# Patient Record
Sex: Male | Born: 1975
Health system: Southern US, Community
[De-identification: ages and names within clinical notes are randomized; demographics above are authoritative.]

## PROBLEM LIST (undated history)

## (undated) DIAGNOSIS — M549 Dorsalgia, unspecified: Secondary | ICD-10-CM

## (undated) DIAGNOSIS — T7840XA Allergy, unspecified, initial encounter: Secondary | ICD-10-CM

## (undated) DIAGNOSIS — F419 Anxiety disorder, unspecified: Secondary | ICD-10-CM

## (undated) DIAGNOSIS — B019 Varicella without complication: Secondary | ICD-10-CM

## (undated) DIAGNOSIS — G8929 Other chronic pain: Secondary | ICD-10-CM

## (undated) HISTORY — PX: BUNIONECTOMY: SHX129

## (undated) HISTORY — DX: Allergy, unspecified, initial encounter: T78.40XA

## (undated) HISTORY — DX: Varicella without complication: B01.9

## (undated) HISTORY — DX: Other chronic pain: G89.29

## (undated) HISTORY — DX: Anxiety disorder, unspecified: F41.9

## (undated) HISTORY — PX: WISDOM TOOTH EXTRACTION: SHX21

## (undated) HISTORY — DX: Dorsalgia, unspecified: M54.9

---

## 2013-01-25 ENCOUNTER — Ambulatory Visit (INDEPENDENT_AMBULATORY_CARE_PROVIDER_SITE_OTHER): Payer: BC Managed Care – PPO | Admitting: Physician Assistant

## 2013-01-25 ENCOUNTER — Encounter: Payer: Self-pay | Admitting: Physician Assistant

## 2013-01-25 VITALS — BP 110/70 | HR 50 | Temp 97.7°F | Resp 16 | Ht 69.0 in | Wt 170.0 lb

## 2013-01-25 DIAGNOSIS — Z136 Encounter for screening for cardiovascular disorders: Secondary | ICD-10-CM

## 2013-01-25 DIAGNOSIS — Z0000 Encounter for general adult medical examination without abnormal findings: Secondary | ICD-10-CM | POA: Insufficient documentation

## 2013-01-25 LAB — TSH: TSH: 3.958 u[IU]/mL (ref 0.350–4.500)

## 2013-01-25 LAB — CBC WITH DIFFERENTIAL/PLATELET
Basophils Absolute: 0 10*3/uL (ref 0.0–0.1)
Basophils Relative: 0 % (ref 0–1)
Eosinophils Absolute: 0.3 10*3/uL (ref 0.0–0.7)
HCT: 41 % (ref 39.0–52.0)
Hemoglobin: 13.8 g/dL (ref 13.0–17.0)
Lymphs Abs: 2 10*3/uL (ref 0.7–4.0)
MCH: 28.9 pg (ref 26.0–34.0)
MCHC: 33.7 g/dL (ref 30.0–36.0)
MCV: 85.8 fL (ref 78.0–100.0)
Monocytes Absolute: 0.4 10*3/uL (ref 0.1–1.0)
Monocytes Relative: 8 % (ref 3–12)
Neutrophils Relative %: 47 % (ref 43–77)
RDW: 12.3 % (ref 11.5–15.5)

## 2013-01-25 LAB — LIPID PANEL
LDL Cholesterol: 86 mg/dL (ref 0–99)
Total CHOL/HDL Ratio: 3.2 Ratio

## 2013-01-25 LAB — BASIC METABOLIC PANEL
BUN: 16 mg/dL (ref 6–23)
CO2: 29 mEq/L (ref 19–32)
Calcium: 9.9 mg/dL (ref 8.4–10.5)
Glucose, Bld: 81 mg/dL (ref 70–99)

## 2013-01-25 NOTE — Patient Instructions (Signed)
Please obtain labs.  I will call you with your results.  If everything looks good, I will gladly sign your booklet to continue boxing.  It was a pleasure taking care of you today.

## 2013-01-25 NOTE — Assessment & Plan Note (Addendum)
Will obtain fasting labs.  Health Maintenance UTD.  EKG shows sinus bradycardia at rate of 38.  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

## 2013-01-25 NOTE — Progress Notes (Signed)
Patient ID: Patrick Ellis, male   DOB: 09/10/1975, 37 y.o.   MRN: [id number]  Patient presents to clinic today to establish care and for sports physical.  Acute Concerns: Patient expresses no acute concerns at today's vist  Chronic Issues: No significant PMH  Health Maintenance: Dental -- up-to-date Vision -- overdue. Immunizations -- Flu shot UTD.  Tb skin test up-to-date.    Past Medical History  Diagnosis Date  . Chicken pox     No current outpatient prescriptions on file prior to visit.   No current facility-administered medications on file prior to visit.    No Known Allergies  Family History  Problem Relation Age of Onset  . HIV Mother 74    Deceased  . Healthy Father   . Diabetes Paternal Grandfather   . Diabetes Paternal Grandmother   . Diabetes type II Paternal Aunt   . Diabetes Paternal Uncle   . Stroke Maternal Aunt   . Healthy Son     x1  . Healthy Daughter     x2    History   Social History  . Marital Status: Married    Spouse Name: N/A    Number of Children: N/A  . Years of Education: N/A   Social History Main Topics  . Smoking status: Never Smoker   . Smokeless tobacco: Never Used  . Alcohol Use: No  . Drug Use: No  . Sexual Activity: Yes    Birth Control/ Protection: None   Other Topics Concern  . None   Social History Narrative  . None   Review of Systems  Constitutional: Negative for fever, chills, weight loss and malaise/fatigue.  HENT: Negative for ear discharge, ear pain, hearing loss and tinnitus.   Eyes: Negative for blurred vision, double vision, photophobia and pain.  Respiratory: Negative for cough, shortness of breath and wheezing.   Cardiovascular: Negative for chest pain and palpitations.  Gastrointestinal: Negative for heartburn, nausea, vomiting, abdominal pain, diarrhea, constipation, blood in stool and melena.  Genitourinary: Negative for dysuria, urgency, frequency, hematuria and flank pain.   Musculoskeletal: Negative for myalgias.  Neurological: Negative for dizziness, seizures, loss of consciousness and headaches.  Endo/Heme/Allergies: Negative for environmental allergies.  Psychiatric/Behavioral: Negative for depression, suicidal ideas and substance abuse. The patient is not nervous/anxious and does not have insomnia.    Filed Vitals:   01/25/13 0943  BP: 110/70  Pulse: 50  Temp: 97.7 F (36.5 C)  Resp: 16   Physical Exam  Vitals reviewed. Constitutional: He is oriented to person, place, and time and well-developed, well-nourished, and in no distress.  HENT:  Head: Normocephalic and atraumatic.  Right Ear: External ear normal.  Left Ear: External ear normal.  Nose: Nose normal.  Mouth/Throat: Oropharynx is clear and moist. No oropharyngeal exudate.  Tympanic membranes within normal limits bilaterally  Eyes: Conjunctivae and EOM are normal. Pupils are equal, round, and reactive to light.  Neck: Normal range of motion. Neck supple.  Cardiovascular: Normal rate, regular rhythm, normal heart sounds and intact distal pulses.   Pulmonary/Chest: Effort normal and breath sounds normal. No respiratory distress. He has no wheezes. He has no rales. He exhibits no tenderness.  Abdominal: Soft. Bowel sounds are normal. He exhibits no distension and no mass. There is no tenderness. There is no rebound and no guarding. Hernia confirmed negative in the umbilical area, confirmed negative in the ventral area, confirmed negative in the right inguinal area and confirmed negative in the left inguinal area.  Genitourinary:  Testes/scrotum normal and penis normal.  Musculoskeletal: Normal range of motion.  Lymphadenopathy:    He has no cervical adenopathy.  Neurological: He is alert and oriented to person, place, and time. No cranial nerve deficit.  Skin: Skin is warm and dry. No rash noted.  Psychiatric: Affect normal.   Assessment/Plan: Visit for preventive health examination Will  obtain fasting labs.  Health Maintenance UTD.  EKG shows sinus bradycardia at rate of 38.  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

## 2013-01-26 LAB — TESTOSTERONE: Testosterone: 460 ng/dL (ref 300–890)

## 2013-01-26 LAB — URINALYSIS, ROUTINE W REFLEX MICROSCOPIC
Glucose, UA: NEGATIVE mg/dL
Hgb urine dipstick: NEGATIVE
Leukocytes, UA: NEGATIVE
Nitrite: NEGATIVE
Protein, ur: NEGATIVE mg/dL
Urobilinogen, UA: 0.2 mg/dL (ref 0.0–1.0)
pH: 5.5 (ref 5.0–8.0)

## 2013-08-20 ENCOUNTER — Telehealth: Payer: Self-pay | Admitting: Physician Assistant

## 2013-08-20 NOTE — Telephone Encounter (Signed)
That’s fine with me

## 2013-08-20 NOTE — Telephone Encounter (Signed)
OK with me.

## 2013-08-20 NOTE — Telephone Encounter (Signed)
Patient would like to switch to [facility]. Is this ok? Thanks!

## 2013-08-21 NOTE — Telephone Encounter (Signed)
Informed patient of this and he will call back to schedule appointment °

## 2015-09-22 ENCOUNTER — Ambulatory Visit: Payer: Self-pay | Admitting: Family Medicine

## 2015-09-23 ENCOUNTER — Encounter: Payer: Self-pay | Admitting: Family Medicine

## 2015-09-23 ENCOUNTER — Ambulatory Visit (INDEPENDENT_AMBULATORY_CARE_PROVIDER_SITE_OTHER): Payer: BLUE CROSS/BLUE SHIELD | Admitting: Family Medicine

## 2015-09-23 VITALS — BP 136/88 | HR 60 | Ht 68.0 in | Wt 165.0 lb

## 2015-09-23 DIAGNOSIS — M25521 Pain in right elbow: Secondary | ICD-10-CM | POA: Diagnosis not present

## 2015-09-23 NOTE — Patient Instructions (Signed)
You have a cyst. This is confirmed to be benign (not dangerous). In many cases these go away on their own. Medicines are unlikely to be helpful at this point. If this becomes bothersome in any way we could inject it with a little bit of cortisone or drain and inject it (you are unlikely to need this). No restrictions on activities or your training. Follow up with me as needed but call me with any questions.

## 2015-09-24 DIAGNOSIS — M25529 Pain in unspecified elbow: Secondary | ICD-10-CM | POA: Insufficient documentation

## 2015-09-24 NOTE — Assessment & Plan Note (Signed)
2/2 cyst.  Does not appear to communicate with the elbow joint - could be traumatic cyst as well.  Reassured patient.  This is not symptomatic so will just monitor - may resolve on its own as well.  Consider aspiration/injection in future if necessary or this bothers him.  F/u prn.  Doubt ibuprofen, other nsaids will help.  Also discussed compression.

## 2015-09-24 NOTE — Progress Notes (Signed)
PCP: Piedad ClimesMartin, William Cody, PA-C  Subjective:   HPI: Patient is a 40 y.o. male here for right elbow swelling.  Patient reports he noticed a small swollen area right elbow since March 2017. 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 Recalls hitting a punching bag on 11/30/14 and had some elbow pain that bothered him for a while but no swelling. Now has no pain with this. Is right handed. No numbness, skin changes, other complaints.  Past Medical History  Diagnosis Date  . Chicken pox     No current outpatient prescriptions on file prior to visit.   No current facility-administered medications on file prior to visit.    Past Surgical History  Procedure Laterality Date  . Bunionectomy      x2 bilateral  . Wisdom tooth extraction      No Known Allergies  Social History   Social History  . Marital Status: Married    Spouse Name: N/A  . Number of Children: N/A  . Years of Education: N/A   Occupational History  . Not on file.   Social History Main Topics  . Smoking status: Never Smoker   . Smokeless tobacco: Never Used  . Alcohol Use: No  . Drug Use: No  . Sexual Activity: Yes    Birth Control/ Protection: None   Other Topics Concern  . Not on file   Social History Narrative    Family History  Problem Relation Age of Onset  . HIV Mother 3045    Deceased  . Healthy Father   . Diabetes Paternal Grandfather   . Diabetes Paternal Grandmother   . Diabetes type II Paternal Aunt   . Diabetes Paternal Uncle   . Stroke Maternal Aunt   . Healthy Son     x1  . Healthy Daughter     x2    BP 136/88 mmHg  Pulse 60  Ht 5\' 8"  (1.727 m)  Wt 165 lb (74.844 kg)  BMI 25.09 kg/m2  Review of Systems: See HPI above.    Objective:  Physical Exam:  Gen: NAD, comfortable in exam room  Right elbow: Small well circumscribed mass over lateral epicondyle - about pea sized.  No skin changes, redness, other swelling or deformity. No tenderness throughout elbow. FROM elbow  and wrist without pain. 5/5 strength all motions. NVI distally.  Left elbow: FROM without pain.  MSK u/s right elbow: ~1.5 x 0.5cm cyst by largest dimension noted in area of swelling.  No communication seen with elbow joint.  No neovascularity.    Assessment & Plan:  1. Right elbow swelling - 2/2 cyst.  Does not appear to communicate with the elbow joint - could be traumatic cyst as well.  Reassured patient.  This is not symptomatic so will just monitor - may resolve on its own as well.  Consider aspiration/injection in future if necessary or this bothers him.  F/u prn.  Doubt ibuprofen, other nsaids will help.  Also discussed compression.

## 2017-01-31 ENCOUNTER — Other Ambulatory Visit (HOSPITAL_BASED_OUTPATIENT_CLINIC_OR_DEPARTMENT_OTHER): Payer: Self-pay | Admitting: Physical Medicine and Rehabilitation

## 2017-01-31 DIAGNOSIS — M545 Low back pain, unspecified: Secondary | ICD-10-CM

## 2017-01-31 DIAGNOSIS — E2749 Other adrenocortical insufficiency: Secondary | ICD-10-CM | POA: Diagnosis not present

## 2017-01-31 DIAGNOSIS — E291 Testicular hypofunction: Secondary | ICD-10-CM | POA: Diagnosis not present

## 2017-01-31 DIAGNOSIS — G894 Chronic pain syndrome: Secondary | ICD-10-CM

## 2017-01-31 DIAGNOSIS — M5127 Other intervertebral disc displacement, lumbosacral region: Secondary | ICD-10-CM

## 2017-01-31 DIAGNOSIS — M5417 Radiculopathy, lumbosacral region: Secondary | ICD-10-CM

## 2017-01-31 DIAGNOSIS — E038 Other specified hypothyroidism: Secondary | ICD-10-CM | POA: Diagnosis not present

## 2017-02-04 ENCOUNTER — Ambulatory Visit (HOSPITAL_BASED_OUTPATIENT_CLINIC_OR_DEPARTMENT_OTHER)
Admission: RE | Admit: 2017-02-04 | Discharge: 2017-02-04 | Disposition: A | Discharge status: Home / Self Care | Payer: BLUE CROSS/BLUE SHIELD | Source: Ambulatory Visit | Attending: Physical Medicine and Rehabilitation | Admitting: Physical Medicine and Rehabilitation

## 2017-02-04 DIAGNOSIS — M5136 Other intervertebral disc degeneration, lumbar region: Secondary | ICD-10-CM | POA: Insufficient documentation

## 2017-02-04 DIAGNOSIS — M5127 Other intervertebral disc displacement, lumbosacral region: Secondary | ICD-10-CM | POA: Diagnosis not present

## 2017-02-04 DIAGNOSIS — R9389 Abnormal findings on diagnostic imaging of other specified body structures: Secondary | ICD-10-CM | POA: Insufficient documentation

## 2017-02-04 DIAGNOSIS — G894 Chronic pain syndrome: Secondary | ICD-10-CM | POA: Diagnosis not present

## 2017-02-04 DIAGNOSIS — M545 Low back pain, unspecified: Secondary | ICD-10-CM

## 2017-02-04 DIAGNOSIS — M5417 Radiculopathy, lumbosacral region: Secondary | ICD-10-CM | POA: Insufficient documentation

## 2017-02-04 DIAGNOSIS — M8938 Hypertrophy of bone, other site: Secondary | ICD-10-CM | POA: Diagnosis not present

## 2017-02-07 DIAGNOSIS — E559 Vitamin D deficiency, unspecified: Secondary | ICD-10-CM | POA: Diagnosis not present

## 2017-02-07 DIAGNOSIS — E291 Testicular hypofunction: Secondary | ICD-10-CM | POA: Diagnosis not present

## 2017-02-07 DIAGNOSIS — E539 Vitamin B deficiency, unspecified: Secondary | ICD-10-CM | POA: Diagnosis not present

## 2017-06-13 DIAGNOSIS — E349 Endocrine disorder, unspecified: Secondary | ICD-10-CM | POA: Diagnosis not present

## 2017-06-13 DIAGNOSIS — E559 Vitamin D deficiency, unspecified: Secondary | ICD-10-CM | POA: Diagnosis not present

## 2017-06-13 DIAGNOSIS — E539 Vitamin B deficiency, unspecified: Secondary | ICD-10-CM | POA: Diagnosis not present

## 2017-06-13 DIAGNOSIS — E038 Other specified hypothyroidism: Secondary | ICD-10-CM | POA: Diagnosis not present

## 2017-06-19 DIAGNOSIS — E559 Vitamin D deficiency, unspecified: Secondary | ICD-10-CM | POA: Diagnosis not present

## 2017-06-19 DIAGNOSIS — E291 Testicular hypofunction: Secondary | ICD-10-CM | POA: Diagnosis not present

## 2017-06-19 DIAGNOSIS — E038 Other specified hypothyroidism: Secondary | ICD-10-CM | POA: Diagnosis not present

## 2017-06-19 DIAGNOSIS — E539 Vitamin B deficiency, unspecified: Secondary | ICD-10-CM | POA: Diagnosis not present

## 2017-08-11 ENCOUNTER — Encounter: Payer: Self-pay | Admitting: Neurology

## 2017-08-14 ENCOUNTER — Encounter: Payer: Self-pay | Admitting: Neurology

## 2017-08-14 ENCOUNTER — Ambulatory Visit: Payer: BLUE CROSS/BLUE SHIELD | Admitting: Neurology

## 2017-08-14 VITALS — BP 114/76 | HR 47 | Ht 70.0 in | Wt 171.0 lb

## 2017-08-14 DIAGNOSIS — M549 Dorsalgia, unspecified: Secondary | ICD-10-CM

## 2017-08-14 DIAGNOSIS — Z72820 Sleep deprivation: Secondary | ICD-10-CM | POA: Insufficient documentation

## 2017-08-14 DIAGNOSIS — G4726 Circadian rhythm sleep disorder, shift work type: Secondary | ICD-10-CM

## 2017-08-14 DIAGNOSIS — F5104 Psychophysiologic insomnia: Secondary | ICD-10-CM

## 2017-08-14 MED ORDER — MELATONIN 5 MG PO TABS: 5.0000 mg | ORAL_TABLET | Freq: Every day | ORAL | 0 refills | Status: DC

## 2017-08-14 NOTE — Progress Notes (Signed)
SLEEP MEDICINE CLINIC   Provider:  Melvyn Novas, M D  Primary Care Physician:    Referring Provider: Deberah Castle,*   Chief Complaint  Patient presents with  . New Patient (Initial Visit)    pt alone, rm 10. pt has been seeing a pain specialist for over a year, pt has hard time with going to sleep, and when he does he doesnt sleep long. feels as if his mind never shuts off. pt wife has told him that he snores intermittently. 3 -4 hours of uninterupted sleep. wakes up and hard to sleep. there are times where he doesnt sleep at all and he also states there are times he binge sleeps    HPI:  Patrick Ellis is a 42 y.o. male , seen here as in a referral   from Dr. Nickola Major for a sleep evaluation.    Chief complaint according to patient :  "trouble going to sleep and staying asleep, waking up feeling numb or just tossing all night" .  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  His last significant change in work hours was in early April about 6 to 7 weeks ago. His Sunday, Monday and Tuesday hours are from 7 PM to 7 AM. Only one job now.  Patrick Ellis who is meanwhile 43 years old has the following history is quoted from his referring physicians notes.  He has been experiencing mid and lower back pain for decades now, starting in 1995 while he was in the [employer]	[place].  He carried a 8 pound radio in addition to his MRI 16 and other things in his backpack, he estimates the weight was upwards of 35 pounds and he hiked 10-20 miles each month, at times he would carry a grenade launcher also not on the longest hikes.  He is right hip and bilateral knee pain as well as ankle pain the pain has slightly worsened over time but it is progressive.  [facility], x-rays were done but he did not get information until very recently a few weeks ago he experienced the most extreme of  pain.  He states his whole body felt like a toothache.   He characterizes pain between 5 out of 10 in intensity to 7 out of 10 in intensity at baseline.  Pain is worse with resuming movement after a long time sitting lying or standing stiffness in the mornings to get out of bed, sitting prolonged by driving, and he has felt that massage and chiropractic treatments in the time have helped he also is using Aspercreme.  Exercise helps the pain while it lasts, sometimes he feels however that he pays a price at night after exercise and sleeps even less.  The difficulties to initiate sleep and staying asleep developed during his [employer] service as well.     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 30,  sometimes he can go home within 15 minutes, and in bed by 9-10 AM.  He doesn't sleep deep but he sleeps until 12 or 1 PM. He cannot go back to sleep- get's and eats. Children come back by 3.30 PM.  Bedroom- cool,quiet, but not dark. Daylight penetrates the room. Mattress is fine , he reports, he struggles to  find a comfortable spot. Toes get numb, fingers and hands get numb. He needs to stretch. Sleeps  on 1 pillow.  Feels not rested.     Sleep medical history and family sleep history: Biological father raised him , biological mother died in Jul 27, 2000, drug user , when he was 25 of AIDS.  No sibling contact  - half sister.  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 He drinks herbal tonics- caffeine through green tea,  not coffee, not sodas, but iced tea.  married with children. Non tobacco user, never has, no ETOH. He used to box- gave boxing up in 07/28/2014. Never has LOC while boxing.  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    .  Review of Systems: Out of a complete 14 system review, the patient complains of only the following symptoms, and all other reviewed systems are negative.  wife reports he snores, purring.  Twitching, numbness, stiffness.    Epworth score 8/ 24  , Fatigue severity score 41   , depression score n/a    Social History   Socioeconomic History  . Marital status: Married    Spouse name: Not on file  . Number of children: Not on file  . Years of education: Not on file  . Highest education level: Not on file  Occupational History  . Not on file  Social Needs  . Financial resource strain: Not on file  . Food insecurity:    Worry: Not on file    Inability: Not on file  . Transportation needs:    Medical: Not on file    Non-medical: Not on file  Tobacco Use  . Smoking status: Never Smoker  . Smokeless tobacco: Never Used  Substance and Sexual Activity  . Alcohol use: No    Alcohol/week: 0.0 oz  . Drug use: No  . Sexual activity: Yes    Birth control/protection: None  Lifestyle  . Physical activity:    Days per week: Not on file    Minutes per session: Not on file  . Stress: Not on file  Relationships  . Social connections:    Talks on phone: Not on file    Gets together: Not on file    Attends religious service: Not on file    Active member of club or organization: Not on file    Attends meetings of clubs or organizations: Not on file    Relationship status: Not on file  . Intimate partner violence:    Fear of current or ex partner: Not on file    Emotionally abused: Not on file    Physically abused: Not on file    Forced sexual activity: Not on file  Other Topics Concern  . Not on file  Social History Narrative  . Not on file    Family History  Problem Relation Age of Onset  . HIV Mother 82       Deceased  . Diabetes Father   . Diabetes Paternal Grandfather   . Diabetes Paternal Grandmother   . Diabetes type II Paternal Aunt   . Diabetes Paternal Uncle   . Stroke Maternal Aunt   . Healthy Son        x1  . Healthy Daughter        x2    Past Medical History:  Diagnosis Date  . Chicken pox     Past Surgical History:  Procedure Laterality Date  . BUNIONECTOMY     x2  bilateral  . WISDOM TOOTH EXTRACTION      Current Outpatient Medications  Medication  Sig Dispense Refill  . DHEA 50 MG CAPS Take 1 capsule by mouth daily.    Marland Kitchen liothyronine (CYTOMEL) 5 MCG tablet Take 1 tablet by mouth daily.     No current facility-administered medications for this visit.     Allergies as of 08/14/2017  . (No Known Allergies)    Vitals: BP 114/76   Pulse (!) 47   Ht  (1.778 m)   Wt 171 lb (77.6 kg)   BMI 24.54 kg/m  Last Weight:  Wt Readings from Last 1 Encounters:  08/14/17 171 lb (77.6 kg)   NWG:NFAO mass index is 24.54 kg/m.     Last Height:   Ht Readings from Last 1 Encounters:  08/14/17  (1.778 m)    Physical exam:  General: The patient is awake, alert and appears not in acute distress. The patient is well groomed. Head: Normocephalic, atraumatic. Neck is supple. Mallampati 2-3,  neck circumference: 15. 25 . Nasal airflow patent ,  TMJ is not evident . Retrognathia is not seen.  Cardiovascular:  Regular rate and rhythm , without  murmurs or carotid bruit, and without distended neck veins. Respiratory: Lungs are clear to auscultation. Skin:  Without evidence of edema, or rash Trunk: BMI is normal . The patient's posture is erect   Neurologic exam : The patient is awake and alert, oriented to place and time.    Attention span & concentration ability appears normal.  Speech is fluent,  without dysarthria, dysphonia or aphasia.  Mood and affect are appropriate.  Cranial nerves: Pupils are equal and briskly reactive to light. Funduscopic exam without evidence of pallor or edema. Extraocular movements  in vertical and horizontal planes intact and without nystagmus. Visual fields by finger perimetry are intact. Hearing to finger rub intact. Facial sensation intact to fine touch. Facial motor strength is symmetric , the  tongue and uvula move midline. Shoulder shrug was symmetrical.  Motor exam:  Normal tone, muscle bulk and symmetric  strength in all extremities. Sensory:  Fine touch, pinprick and vibration were tested in all extremities. Proprioception tested in the upper extremities was normal. Coordination: Rapid alternating movements in the fingers/hands was normal. Finger-to-nose maneuver  normal without evidence of ataxia, dysmetria or tremor. Gait and station: Patient walks without assistive device  Stance is stable and normal.   Turns with 3 Steps. Romberg testing is  negative. Deep tendon reflexes: in the  upper and lower extremities are symmetric and intact. Babinski maneuver response is  downgoing.   I would like to thank Dr. Ginger Organ for referring this pleasant patient to me today, he reports a slow progressive decline in sleep quality certainly correlating with his reported pain increasing over the same time.  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  His daytime sleep is not as refreshing or as consolidated as it could be.  We discussed today that he may improve his sleep efficiency by using a light blocking sleep mask or by using light blocking techniques on the window in the bedroom itself.  I also would like him to consider using melatonin 5 mg or less once he comes home after work.  This may ease him into sleep and allow him to sleep a little longer.  His wife has noticed audible breathing and possibly snoring.  For this reason a sleep test is indicated.  There has not been a witnessed event  of breath-holding, air hunger weakness, the feeling of choking in his sleep.  [facility] were not [facility] to me- he had another exam last Saturday. No report of PTSD, sleep walking, sleep enactment.   Assessment:  After physical and neurologic examination, review of laboratory studies,  Personal review of imaging studies, reports of other /same  Imaging studies, results of polysomnography and / or neurophysiology testing and pre-existing  records as far as provided in visit., my assessment is   1)  PSG with daytime set up for a shift worker. We may find evidence of PLMs, hypoxia. There can be many spontaneous events - I would like a daytime attended sleep study.Marland Kitchen   2) Pain may affect sleep.   3) insomnia in daytime , restricted sleep time,sleep deprivation.    The patient was advised of the nature of the diagnosed disorder , the treatment options and the  risks for general health and wellness arising from not treating the condition.   I spent more than 45 minutes of face to face time with the patient.  Greater than 50% of time was spent in counseling and coordination of care. We have discussed the diagnosis and differential and I answered the patient's questions.    Plan:  Treatment plan and additional workup :  Attended PSG with expanded EEG montage , daytime for shift worker.     Melvyn Novas, MD 08/14/2017, 1:16 PM  Certified in Neurology by ABPN Certified in Sleep Medicine by [facility] [address] [place], [place] [address]

## 2017-08-15 ENCOUNTER — Other Ambulatory Visit: Payer: Self-pay | Admitting: Neurology

## 2017-08-15 ENCOUNTER — Telehealth: Payer: Self-pay

## 2017-08-15 DIAGNOSIS — Z72820 Sleep deprivation: Secondary | ICD-10-CM

## 2017-08-15 DIAGNOSIS — F5104 Psychophysiologic insomnia: Secondary | ICD-10-CM

## 2017-08-15 DIAGNOSIS — G4726 Circadian rhythm sleep disorder, shift work type: Secondary | ICD-10-CM

## 2017-08-15 NOTE — Telephone Encounter (Signed)
Order placed

## 2017-08-15 NOTE — Telephone Encounter (Signed)
[employer] denied in lab sleep study, need HST order 

## 2017-08-29 ENCOUNTER — Encounter: Payer: Self-pay | Admitting: Neurology

## 2017-08-29 ENCOUNTER — Ambulatory Visit: Payer: BLUE CROSS/BLUE SHIELD | Admitting: Neurology

## 2017-08-29 ENCOUNTER — Ambulatory Visit (INDEPENDENT_AMBULATORY_CARE_PROVIDER_SITE_OTHER): Payer: BLUE CROSS/BLUE SHIELD | Admitting: Neurology

## 2017-08-29 DIAGNOSIS — R202 Paresthesia of skin: Secondary | ICD-10-CM

## 2017-08-29 DIAGNOSIS — F5104 Psychophysiologic insomnia: Secondary | ICD-10-CM

## 2017-08-29 NOTE — Procedures (Signed)
     HISTORY:  Heloise BeechamLatareus Knobloch is a 42 year old gentleman with a 3-year history of episodes of numbness and sensory alteration beginning in the right foot with a gradual spread over 3 to 4 minutes to include the right arm and back of the head, the patient will have some twitching of the right lower face with these events, with resolution of the sensory episodes in 15 or 20 minutes.  The patient is averaging 1 event a week.  He is being evaluated for this.  NERVE CONDUCTION STUDIES:  Nerve conduction studies were performed on the right upper extremity. The distal motor latencies and motor amplitudes for the median and ulnar nerves were within normal limits. The nerve conduction velocities for these nerves were also normal. The sensory latencies for the median, radial, and ulnar nerves were normal.  The F-wave latency for the right ulnar nerve was within normal limits.  Nerve conduction studies were performed on the right lower extremity. The distal motor latencies and motor amplitudes for the peroneal and posterior tibial nerves were within normal limits. The nerve conduction velocities for these nerves were also normal. The sensory latencies for the peroneal and sural nerves were within normal limits.  The F-wave latency for the right posterior tibial nerve was normal.   EMG STUDIES:  EMG study was performed on the right upper extremity:  The first dorsal interosseous muscle reveals 2 to 4 K units with full recruitment. No fibrillations or positive waves were noted. The abductor pollicis brevis muscle reveals 2 to 4 K units with full recruitment. No fibrillations or positive waves were noted. The extensor indicis proprius muscle reveals 1 to 3 K units with full recruitment. No fibrillations or positive waves were noted. The biceps muscle reveals 1 to 2 K units with full recruitment. No fibrillations or positive waves were noted. The triceps muscle reveals 2 to 4 K units with full recruitment.  No fibrillations or positive waves were noted. The anterior deltoid muscle reveals 2 to 3 K units with full recruitment. No fibrillations or positive waves were noted. The cervical paraspinal muscles were tested at 2 levels. No abnormalities of insertional activity were seen at either level tested. There was good relaxation.  EMG study was performed on the right lower extremity:  The tibialis anterior muscle reveals 2 to 4K motor units with full recruitment. No fibrillations or positive waves were seen. The peroneus tertius muscle reveals 2 to 4K motor units with full recruitment. No fibrillations or positive waves were seen. The medial gastrocnemius muscle reveals 1 to 3K motor units with full recruitment. No fibrillations or positive waves were seen. The vastus lateralis muscle reveals 2 to 4K motor units with full recruitment. No fibrillations or positive waves were seen. The iliopsoas muscle reveals 2 to 4K motor units with full recruitment. No fibrillations or positive waves were seen. The lumbosacral paraspinal muscles were tested at 3 levels, and revealed no abnormalities of insertional activity at all 3 levels tested. There was good relaxation.   IMPRESSION:  Nerve conduction studies done on the right upper and lower extremities were unremarkable, no evidence of a neuropathy is seen.  EMG of the right upper and right lower extremities were unremarkable without evidence of a cervical or lumbar radiculopathy noted.  Marlan Palau. Keith Melynda Krzywicki MD 08/29/2017 10:18 AM  [facility] [address] [place], [place] [id number]  Phone [phone] Fax [phone]

## 2017-08-29 NOTE — Progress Notes (Signed)
Please refer to EMG and nerve conduction study procedure note. 

## 2017-08-30 NOTE — Progress Notes (Signed)
[facility]    Nerve / Sites Muscle Latency Ref. Amplitude Ref. Rel Amp Segments Distance Velocity Ref. Area    ms ms mV mV %  cm m/s m/s mVms  R Median - APB     Wrist APB 3.4 ?4.4 6.3 ?4.0 100 Wrist - APB 7   19.0     Upper arm APB 7.9  5.9  94.2 Upper arm - Wrist 25 56 ?49 17.7  R Ulnar - ADM     Wrist ADM 2.5 ?3.3 10.5 ?6.0 100 Wrist - ADM 7   35.5     B.Elbow ADM 6.6  8.7  83.2 B.Elbow - Wrist 22 54 ?49 30.6     A.Elbow ADM 8.8  8.9  102 A.Elbow - B.Elbow 12 55 ?49 33.2         A.Elbow - Wrist      R Peroneal - EDB     Ankle EDB 5.1 ?6.5 7.8 ?2.0 100 Ankle - EDB 9   22.8     Fib head EDB 11.9  7.6  98.3 Fib head - Ankle 33 48 ?44 23.2     Pop fossa EDB 14.1  6.2  81 Pop fossa - Fib head 10 47 ?44 19.1         Pop fossa - Ankle      R Tibial - AH     Ankle AH 5.3 ?5.8 11.7 ?4.0 100 Ankle - AH 9   25.7     Pop fossa AH 14.2  4.1  34.6 Pop fossa - Ankle 37 42 ?41 12.9             [facility]    Nerve / Sites Rec. Site Peak Lat Ref.  Amp Ref. Segments Distance Peak Diff Ref.    ms ms V V  cm ms ms  R Radial - Anatomical snuff box (Forearm)     Forearm Wrist 2.7 ?2.9 12 ?15 Forearm - Wrist 10    R Sural - Ankle (Calf)     Calf Ankle 3.6 ?4.4 9 ?6 Calf - Ankle 14    R Superficial peroneal - Ankle     Lat leg Ankle 3.9 ?4.4 8 ?6 Lat leg - Ankle 14    R Median, Ulnar - Transcarpal comparison     Median Palm Wrist 2.2 ?2.2 42 ?35 Median Palm - Wrist 8       Ulnar Palm Wrist 2.1 ?2.2 12 ?12 Ulnar Palm - Wrist 8          Median Palm - Ulnar Palm  0.2 ?0.4  R Median - Orthodromic (Dig II, Mid palm)     Dig II Wrist 3.3 ?3.4 14 ?10 Dig II - Wrist 13    R Ulnar - Orthodromic, (Dig V, Mid palm)     Dig V Wrist 2.8 ?3.1 10 ?5 Dig V - Wrist 2811                   F  Wave    Nerve F Lat Ref.   ms ms  R Tibial - AH 30.9 ?56.0  R Ulnar - ADM 29.7 ?32.0         EMG full

## 2017-09-06 ENCOUNTER — Ambulatory Visit (INDEPENDENT_AMBULATORY_CARE_PROVIDER_SITE_OTHER): Payer: BLUE CROSS/BLUE SHIELD | Admitting: Neurology

## 2017-09-06 DIAGNOSIS — Z72820 Sleep deprivation: Secondary | ICD-10-CM

## 2017-09-06 DIAGNOSIS — G4710 Hypersomnia, unspecified: Secondary | ICD-10-CM

## 2017-09-06 DIAGNOSIS — G4726 Circadian rhythm sleep disorder, shift work type: Secondary | ICD-10-CM

## 2017-09-06 DIAGNOSIS — F5104 Psychophysiologic insomnia: Secondary | ICD-10-CM

## 2017-09-14 ENCOUNTER — Other Ambulatory Visit: Payer: Self-pay | Admitting: Neurology

## 2017-09-14 ENCOUNTER — Telehealth: Payer: Self-pay | Admitting: Neurology

## 2017-09-14 DIAGNOSIS — G4761 Periodic limb movement disorder: Secondary | ICD-10-CM

## 2017-09-14 DIAGNOSIS — R202 Paresthesia of skin: Secondary | ICD-10-CM

## 2017-09-14 DIAGNOSIS — F5104 Psychophysiologic insomnia: Secondary | ICD-10-CM

## 2017-09-14 DIAGNOSIS — Z72820 Sleep deprivation: Secondary | ICD-10-CM

## 2017-09-14 NOTE — Telephone Encounter (Signed)
Called the patient. His wife answered. I offered to review the sleep study results with her. She request to take a message and have the pt contact us back. Gave her the number to the office for him to return our call.

## 2017-09-14 NOTE — Telephone Encounter (Signed)
Pt returned call and I was able to review the home sleep test results with him. I informed him that the apnea was not found on the HST and his oxygen and heart rate remained stable. Dr Dohmeier states that since his main concern was more concern of movements and also looking at insomnia our hope is that insurance will allow us to bring him in for a in lab study to evaluate the extremities and insomnia concern. Pt verbalized understanding and was appreciative.

## 2017-09-15 NOTE — Procedures (Signed)
[facility] [address] [place], [place] [address] NAME:   Heloise BeechamLatareus Ellis                                                               DOB: 09/17/1975 MEDICAL RECORD NUMBER [id number]                                                      DOS:  09/06/17 REFERRING PHYSICIAN: Herminio HeadsShawn Dalton- Bethea, MD STUDY PERFORMED: Home Sleep Study on Apnea link   HISTORY: Marita KansasLatareus T. Calzada is a 42 y.o. male patient, seen here for "trouble going to sleep and staying asleep, waking up feeling numb or just tossing all night".   [name] works night shifts for many years and has often worked 2 jobs.  His last significant change in work hours was in early April about 6 to 7 weeks ago. His Sunday, Monday and Tuesday hours are from 7 PM to 7 AM, working one job now.  He has back pain, has right hip and bilateral knee pain as well as ankle pain- the pain has slightly worsened over time but it is progressive and interferes with his rest. [facility], x-rays were done after he experienced the most extreme of pain. He states his whole body felt like a toothache.   Epworth Sleepiness score: 8/ 24, Fatigue severity score: 41 points, BMI: 24.5  STUDY RESULTS:  Total Recording Time: 8 hours, 45 minutes- valid test time 8 h 5 minutes Total Apnea/Hypopnea Index (AHI):  0.1/h, RDI:  5.2 /h Average Oxygen Saturation:   96 %, Lowest Oxygen Saturation: 90 % =Total Time in Oxygen Saturation below 89 %: 0.0 minutes  Average Heart Rate:   49 bpm in NSR (between 40 and 91 bpm) IMPRESSION: This HST does not indicate that apnea is present, but snoring is moderate. Given the patients symptoms of pain in limbs and back, a HST is insufficient to establish PLM presence or even confirm insomnia. An attended sleep study is needed to confirm if insomnia is present or the complaint based on a different perception of sleep.   RECOMMENDATION: attended PSG with parasomnia EEG / limb movement attention. I certify that I have reviewed the raw data recording prior to the issuance of this report in accordance with the standards of the American Academy of Sleep Medicine (AASM). Melvyn Novasarmen Abigayl Hor, M.D.    09-14-2017     Medical Director of [facility], accredited by the AASM. Diplomat of the ABPN and ABSM.

## 2017-09-18 ENCOUNTER — Encounter: Payer: BLUE CROSS/BLUE SHIELD | Admitting: Neurology

## 2017-10-09 ENCOUNTER — Encounter

## 2017-10-09 ENCOUNTER — Institutional Professional Consult (permissible substitution): Payer: BLUE CROSS/BLUE SHIELD | Admitting: Neurology

## 2017-10-11 ENCOUNTER — Ambulatory Visit (INDEPENDENT_AMBULATORY_CARE_PROVIDER_SITE_OTHER): Payer: BLUE CROSS/BLUE SHIELD | Admitting: Neurology

## 2017-10-11 DIAGNOSIS — F5104 Psychophysiologic insomnia: Secondary | ICD-10-CM

## 2017-10-11 DIAGNOSIS — R202 Paresthesia of skin: Secondary | ICD-10-CM

## 2017-10-11 DIAGNOSIS — Z72820 Sleep deprivation: Secondary | ICD-10-CM

## 2017-10-11 DIAGNOSIS — G4761 Periodic limb movement disorder: Secondary | ICD-10-CM

## 2017-10-20 NOTE — Procedures (Signed)
PATIENT'S NAME:  Patrick Ellis, Patrick T. DOB:      Oct 15, 1975      MR#:    [id number]     DATE OF RECORDING: 10/11/2017 REFERRING M.D.:  Patrick HeadsShawn Dalton-Bethea, MD Study Performed:   Baseline Polysomnogram, following HST from 09-06-2017 without diagnosis . HISTORY:  Patrick Ellis is a 42 y.o. male patient, seen here as in a referral from Dr. Nickola Ellis for a sleep evaluation. Chief complaint according to patient:  "trouble going to sleep and staying asleep, waking up feeling numb or just tossing all night".  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 His last significant change in work hours was in early April 2019. His Sunday, Monday and Tuesday hours are now from 7 PM to 7 AM. Only one job now. 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 Work is over at 7. 30 AM, sometimes he can go home within 15 minutes, and is in bed by 9 AM. He doesn't sleep deep but he sleeps until 12 or 1 PM.    Patrick Ellis has been experiencing mid and lower back pain for decades now, starting in 1995 while he was in the [employer]. He carried his backpack, he estimates the weight was upwards of 35 pounds and he hiked 10-20 miles each month, at times he had to carry a Economistgrenade launcher.  His right hip and bilateral knee pain as well as ankle pain the pain has slightly worsened over time. He states his whole body felt like a toothache. Pain is worse with resuming movement after a long time sitting lying or standing- painful stiffness.  Exercise helps the pain while it lasts, sometimes he feels however that he pays a price at night after exercise and sleeps even less.  The difficulties to initiate sleep and staying asleep developed during his [employer] service as well.  The patient endorsed the Epworth Sleepiness Scale at 8/24 points, FSS 41 points.   The patient's weight 171 pounds with a height of 70 (inches), resulting in a BMI of 24.6 kg/m2. The patient's neck circumference measured  15.2 inches.  CURRENT MEDICATIONS: DHEA, Cytomel.   PROCEDURE:  This is a multichannel digital polysomnogram utilizing the Somnostar 11.2 system.  Electrodes and sensors were applied and monitored per AASM Specifications.   EEG, EOG, Chin and Limb EMG, were sampled at 200 Hz.  ECG, Snore and Nasal Pressure, Thermal Airflow, Respiratory Effort, CPAP Flow and Pressure, Oximetry was sampled at 50 Hz. Digital video and audio were recorded.      BASELINE STUDY: Lights Out was at 23:03 and Lights On at 05:00.  Total recording time (TRT) was 357.5 minutes, with a total sleep time (TST) of 281.5 minutes. The patient's sleep latency was 13 minutes.  REM latency was 81 minutes.  The sleep efficiency was 78.7 %.     SLEEP ARCHITECTURE: WASO (Wake after sleep onset) was 34 minutes.  There were 19.5 minutes in Stage N1, 168 minutes Stage N2, 68.5 minutes Stage N3 and 25.5 minutes in Stage REM.  The percentage of Stage N1 was 6.9%, Stage N2 was 59.7%, Stage N3 was 24.3% and Stage R (REM sleep) was 9.1%.  RESPIRATORY ANALYSIS:  There were a total of 13 respiratory events:  0 obstructive apneas, 5 central apneas and 8 hypopneas. The patient also had 0 respiratory event related arousals (RERAs).      The total APNEA/HYPOPNEA INDEX (AHI) was 2.8 /hour and the total RESPIRATORY DISTURBANCE INDEX  was 2.8 /hour. 5 events occurred in REM sleep and 13 events in NREM. The REM AHI was 11.8 /hour, versus a non-REM AHI of 1.9. The patient spent 216 minutes of total sleep time in the supine position and 66 minutes in non-supine. The supine AHI was 3.6 versus a non-supine AHI of 0.0.  OXYGEN SATURATION & C02:  The Wake baseline 02 saturation was 93%, with the lowest being 91%. Time spent below 89% saturation equaled 0 minutes.   PERIODIC LIMB MOVEMENTS:  The patient had a total of 5 Periodic Limb Movements.  The Periodic Limb Movement (PLM) index was 1.1 and the PLM Arousal index was 0.4/hour. There were many non - periodic  movements. The arousals were noted as: 71 were spontaneous, 2 were associated with PLMs, and 2 were associated with respiratory events.     Audio and video analysis did not show any abnormal or unusual movements, but yelled out during REM sleep. The patient took bathroom break. Snoring was noted during supine sleep. EKG was in keeping with normal sinus rhythm (NSR). Post-study, the patient indicated that sleep was the same as usual.   IMPRESSION: Extremely fragmented sleep, due to spontaneous arousals.   1. No evidence of sleep apnea, only snoring.  2. Very frequent Limb Movements, some periodic. PLM Disorder (PLMD) 3. Behavior noted, yelling in REM. REM BD is a possibility- but limb movements were not present during REM sleep.    RECOMMENDATIONS:  1) Spontaneous arousals can be pain related.  2)   I certify that I have reviewed the entire raw data recording prior to the issuance of this report in accordance with the Standards of Accreditation of the American Academy of Sleep Medicine (AASM)   Melvyn Novas, MD    10-20-2017  Diplomat, American Board of Psychiatry and Neurology  Diplomat, American Board of Sleep Medicine Medical Director, [facility] [employer]

## 2017-10-25 ENCOUNTER — Telehealth: Payer: Self-pay | Admitting: Neurology

## 2017-10-25 NOTE — Telephone Encounter (Signed)
-----   Message from Carmen Dohmeier, MD sent at 10/20/2017  2:38 PM EDT ----- Cc Dr Dalton- Bethea  Should read no PLM disorder!   This attended study confirmed LMs, non -periodic limb movements ( these are seen with pain) and do not correlate to RLS, REM BD- PLMs Mainly many spontaneous arousals.  Since there were no other physiological changes associated with spontaneous arousals I suspect that these are pain/ sensation related.     

## 2017-10-25 NOTE — Telephone Encounter (Signed)
Called patient to discuss sleep study results. No answer at this time. LVM for the patient to call back.   

## 2017-10-26 ENCOUNTER — Telehealth: Payer: Self-pay | Admitting: Neurology

## 2017-10-26 NOTE — Telephone Encounter (Signed)
Pt returned call and I was able to review with the patient the sleep study results. Informed the patient that there was nothing of any concern that could be treated for sleep.  Pt inquired about the NCV test in June and I reviewed those results with him also. Pt verbalized understanding. Pt had no questions at this time but was encouraged to call back if questions arise.

## 2017-10-26 NOTE — Telephone Encounter (Signed)
-----   Message from Melvyn Novasarmen Dohmeier, MD sent at 10/20/2017  2:38 PM EDT ----- Cc Dr Freida BusmanaltonEduard Clos- Bethea  Should read no PLM disorder!   This attended study confirmed LMs, non -periodic limb movements ( these are seen with pain) and do not correlate to RLS, REM BD- PLMs Mainly many spontaneous arousals.  Since there were no other physiological changes associated with spontaneous arousals I suspect that these are pain/ sensation related.

## 2017-12-28 DIAGNOSIS — M542 Cervicalgia: Secondary | ICD-10-CM | POA: Diagnosis not present

## 2017-12-28 DIAGNOSIS — M9902 Segmental and somatic dysfunction of thoracic region: Secondary | ICD-10-CM | POA: Diagnosis not present

## 2017-12-28 DIAGNOSIS — M9901 Segmental and somatic dysfunction of cervical region: Secondary | ICD-10-CM | POA: Diagnosis not present

## 2017-12-28 DIAGNOSIS — M47812 Spondylosis without myelopathy or radiculopathy, cervical region: Secondary | ICD-10-CM | POA: Diagnosis not present

## 2018-01-03 DIAGNOSIS — M47812 Spondylosis without myelopathy or radiculopathy, cervical region: Secondary | ICD-10-CM | POA: Diagnosis not present

## 2018-01-03 DIAGNOSIS — M9902 Segmental and somatic dysfunction of thoracic region: Secondary | ICD-10-CM | POA: Diagnosis not present

## 2018-01-03 DIAGNOSIS — M542 Cervicalgia: Secondary | ICD-10-CM | POA: Diagnosis not present

## 2018-01-03 DIAGNOSIS — M9901 Segmental and somatic dysfunction of cervical region: Secondary | ICD-10-CM | POA: Diagnosis not present

## 2018-01-04 DIAGNOSIS — M542 Cervicalgia: Secondary | ICD-10-CM | POA: Diagnosis not present

## 2018-01-04 DIAGNOSIS — M9901 Segmental and somatic dysfunction of cervical region: Secondary | ICD-10-CM | POA: Diagnosis not present

## 2018-01-04 DIAGNOSIS — M9902 Segmental and somatic dysfunction of thoracic region: Secondary | ICD-10-CM | POA: Diagnosis not present

## 2018-01-04 DIAGNOSIS — M47812 Spondylosis without myelopathy or radiculopathy, cervical region: Secondary | ICD-10-CM | POA: Diagnosis not present

## 2018-01-08 DIAGNOSIS — M47812 Spondylosis without myelopathy or radiculopathy, cervical region: Secondary | ICD-10-CM | POA: Diagnosis not present

## 2018-01-08 DIAGNOSIS — M9902 Segmental and somatic dysfunction of thoracic region: Secondary | ICD-10-CM | POA: Diagnosis not present

## 2018-01-08 DIAGNOSIS — M542 Cervicalgia: Secondary | ICD-10-CM | POA: Diagnosis not present

## 2018-01-08 DIAGNOSIS — M9901 Segmental and somatic dysfunction of cervical region: Secondary | ICD-10-CM | POA: Diagnosis not present

## 2018-01-10 DIAGNOSIS — M9901 Segmental and somatic dysfunction of cervical region: Secondary | ICD-10-CM | POA: Diagnosis not present

## 2018-01-10 DIAGNOSIS — M47812 Spondylosis without myelopathy or radiculopathy, cervical region: Secondary | ICD-10-CM | POA: Diagnosis not present

## 2018-01-10 DIAGNOSIS — M542 Cervicalgia: Secondary | ICD-10-CM | POA: Diagnosis not present

## 2018-01-10 DIAGNOSIS — M9902 Segmental and somatic dysfunction of thoracic region: Secondary | ICD-10-CM | POA: Diagnosis not present

## 2018-01-11 DIAGNOSIS — M542 Cervicalgia: Secondary | ICD-10-CM | POA: Diagnosis not present

## 2018-01-11 DIAGNOSIS — M9902 Segmental and somatic dysfunction of thoracic region: Secondary | ICD-10-CM | POA: Diagnosis not present

## 2018-01-11 DIAGNOSIS — M47812 Spondylosis without myelopathy or radiculopathy, cervical region: Secondary | ICD-10-CM | POA: Diagnosis not present

## 2018-01-11 DIAGNOSIS — M9901 Segmental and somatic dysfunction of cervical region: Secondary | ICD-10-CM | POA: Diagnosis not present

## 2018-01-17 DIAGNOSIS — M9902 Segmental and somatic dysfunction of thoracic region: Secondary | ICD-10-CM | POA: Diagnosis not present

## 2018-01-17 DIAGNOSIS — M542 Cervicalgia: Secondary | ICD-10-CM | POA: Diagnosis not present

## 2018-01-17 DIAGNOSIS — M9901 Segmental and somatic dysfunction of cervical region: Secondary | ICD-10-CM | POA: Diagnosis not present

## 2018-01-17 DIAGNOSIS — M47812 Spondylosis without myelopathy or radiculopathy, cervical region: Secondary | ICD-10-CM | POA: Diagnosis not present

## 2018-01-18 DIAGNOSIS — M9901 Segmental and somatic dysfunction of cervical region: Secondary | ICD-10-CM | POA: Diagnosis not present

## 2018-01-18 DIAGNOSIS — M47812 Spondylosis without myelopathy or radiculopathy, cervical region: Secondary | ICD-10-CM | POA: Diagnosis not present

## 2018-01-18 DIAGNOSIS — M542 Cervicalgia: Secondary | ICD-10-CM | POA: Diagnosis not present

## 2018-01-18 DIAGNOSIS — M9902 Segmental and somatic dysfunction of thoracic region: Secondary | ICD-10-CM | POA: Diagnosis not present

## 2018-01-31 DIAGNOSIS — M9901 Segmental and somatic dysfunction of cervical region: Secondary | ICD-10-CM | POA: Diagnosis not present

## 2018-01-31 DIAGNOSIS — M47812 Spondylosis without myelopathy or radiculopathy, cervical region: Secondary | ICD-10-CM | POA: Diagnosis not present

## 2018-01-31 DIAGNOSIS — M542 Cervicalgia: Secondary | ICD-10-CM | POA: Diagnosis not present

## 2018-01-31 DIAGNOSIS — M9902 Segmental and somatic dysfunction of thoracic region: Secondary | ICD-10-CM | POA: Diagnosis not present

## 2018-02-07 DIAGNOSIS — M9901 Segmental and somatic dysfunction of cervical region: Secondary | ICD-10-CM | POA: Diagnosis not present

## 2018-02-07 DIAGNOSIS — M9902 Segmental and somatic dysfunction of thoracic region: Secondary | ICD-10-CM | POA: Diagnosis not present

## 2018-02-07 DIAGNOSIS — M542 Cervicalgia: Secondary | ICD-10-CM | POA: Diagnosis not present

## 2018-02-07 DIAGNOSIS — M47812 Spondylosis without myelopathy or radiculopathy, cervical region: Secondary | ICD-10-CM | POA: Diagnosis not present

## 2018-02-11 ENCOUNTER — Emergency Department (HOSPITAL_BASED_OUTPATIENT_CLINIC_OR_DEPARTMENT_OTHER)
Admission: EM | Admit: 2018-02-11 | Discharge: 2018-02-12 | Disposition: A | Discharge status: Home / Self Care | Payer: BLUE CROSS/BLUE SHIELD | Attending: Emergency Medicine | Admitting: Emergency Medicine

## 2018-02-11 ENCOUNTER — Other Ambulatory Visit: Payer: Self-pay

## 2018-02-11 ENCOUNTER — Encounter (HOSPITAL_BASED_OUTPATIENT_CLINIC_OR_DEPARTMENT_OTHER): Payer: Self-pay | Admitting: *Deleted

## 2018-02-11 DIAGNOSIS — Z79899 Other long term (current) drug therapy: Secondary | ICD-10-CM | POA: Insufficient documentation

## 2018-02-11 DIAGNOSIS — R51 Headache: Secondary | ICD-10-CM | POA: Insufficient documentation

## 2018-02-11 DIAGNOSIS — R519 Headache, unspecified: Secondary | ICD-10-CM

## 2018-02-11 MED ORDER — KETOROLAC TROMETHAMINE 30 MG/ML IJ SOLN
30.0000 mg | Freq: Once | INTRAMUSCULAR | Status: AC
  Administered 2018-02-11: 30 mg via INTRAMUSCULAR
  Filled 2018-02-11: qty 1

## 2018-02-11 NOTE — ED Notes (Addendum)
Pt states he has had a headache intermittently since friday. Pt denies injury to head or history of headaches/migraines. He has been taking ibuprofen but the symptoms return when the medication wears off. Pt states he has not taken any medication today for pain. Also states the pain makes him feel nauseated, but denies vomiting.

## 2018-02-11 NOTE — ED Triage Notes (Signed)
Pt reports headache since Friday. C/o pain behind his eyes. Denies cold sx. Pt has taken ibuprofen with some relief but headache came back

## 2018-02-12 NOTE — Discharge Instructions (Addendum)
You were seen today for headache.  Your headache was well controlled when you were evaluated.  Given where your pain is and your work, you should have your eyes checked.  If you develop recurrent headaches, you should be reevaluated.  Take naproxen or ibuprofen as needed for pain.

## 2018-02-12 NOTE — ED Provider Notes (Signed)
[facility] EMERGENCY DEPARTMENT Provider Note   CSN: [id number] Arrival date & time: 02/11/18  2153     History   Chief Complaint Chief Complaint  Patient presents with  . Headache    HPI Patrick Ellis is a 42 y.o. male.  HPI  This is a 42 year old male who presents with headache.  Patient reports that he has had a headache since Friday.  He describes as pressure behind his eyes.  He has taken ibuprofen with some relief but headache has recurred.  Denies any nausea, vomiting, photophobia.  Denies fevers or neck stiffness.  Does report some lightheadedness.  States that he feels hydrated.  Denies worst headache of his life.  No history of migraines.  Currently he states that he "does not really have pain."  He does work at a computer all day long and has not had his eyes checked recently.   Past Medical History:  Diagnosis Date  . Chicken pox     Patient Active Problem List   Diagnosis Date Noted  . Persistent circadian rhythm sleep disorder, shift work type 08/14/2017  . Chronic insomnia 08/14/2017  . Sleep deprivation 08/14/2017  . Arthralgia of back 08/14/2017  . Elbow pain 09/24/2015  . Visit for preventive health examination 01/25/2013    Past Surgical History:  Procedure Laterality Date  . BUNIONECTOMY     x2 bilateral  . WISDOM TOOTH EXTRACTION          Home Medications    Prior to Admission medications   Medication Sig Start Date End Date Taking? Authorizing Provider  DHEA 50 MG CAPS Take 1 capsule by mouth daily.    [provider]  liothyronine (CYTOMEL) 5 MCG tablet Take 1 tablet by mouth daily. 06/21/17   [provider]  Melatonin 5 MG TABS Take 1 tablet (5 mg total) by mouth at bedtime. 08/14/17   Dohmeier, Porfirio Mylararmen, MD    Family History Family History  Problem Relation Age of Onset  . HIV Mother 5545       Deceased  . Diabetes Father   . Diabetes Paternal Grandfather   . Diabetes Paternal Grandmother   .  Diabetes type II Paternal Aunt   . Diabetes Paternal Uncle   . Stroke Maternal Aunt   . Healthy Son        x1  . Healthy Daughter        x2    Social History Social History   Tobacco Use  . Smoking status: Never Smoker  . Smokeless tobacco: Never Used  Substance Use Topics  . Alcohol use: No    Alcohol/week: 0.0 standard drinks  . Drug use: No     Allergies   Patient has no known allergies.   Review of Systems Review of Systems  Constitutional: Negative for fever.  Eyes: Negative for photophobia and visual disturbance.  Respiratory: Negative for shortness of breath.   Cardiovascular: Negative for chest pain.  Musculoskeletal: Negative for neck pain.  Neurological: Positive for light-headedness and headaches. Negative for dizziness, weakness and numbness.  All other systems reviewed and are negative.    Physical Exam Updated Vital Signs BP (!) 142/94 (BP Location: Right Arm)   Pulse 63   Temp 97.9 F (36.6 C) (Oral)   Resp 16   Ht 1.753 m (5\' 9" )   Wt 77.1 kg   SpO2 99%   BMI 25.10 kg/m   Physical Exam  Constitutional: He is oriented to person, place, and time.  He appears well-developed and well-nourished.  HENT:  Head: Normocephalic and atraumatic.  Eyes: Pupils are equal, round, and reactive to light. EOM are normal. Right eye exhibits no nystagmus. Left eye exhibits no nystagmus.  Neck: Normal range of motion. Neck supple.  Cardiovascular: Normal rate, regular rhythm and normal heart sounds.  No murmur heard. Pulmonary/Chest: Effort normal and breath sounds normal. No respiratory distress. He has no wheezes.  Abdominal: Soft. There is no tenderness. There is no rebound.  Neurological: He is alert and oriented to person, place, and time.  Cranial nerves II through XII intact, 5 out of 5 strength in all 4 extremities, normal gait, no dysmetria to finger-nose-finger  Skin: Skin is warm and dry.  Psychiatric: He has a normal mood and affect.  Nursing  note and vitals reviewed.    ED Treatments / Results  Labs (all labs ordered are listed, but only abnormal results are displayed) Labs Reviewed - No data to display  EKG None  Radiology No results found.  Procedures Procedures (including critical care time)  Medications Ordered in ED Medications  ketorolac (TORADOL) 30 MG/ML injection 30 mg (30 mg Intramuscular Given 02/11/18 2347)     Initial Impression / Assessment and Plan / ED Course  I have reviewed the triage vital signs and the nursing notes.  Pertinent labs & imaging results that were available during my care of the patient were reviewed by me and considered in my medical decision making (see chart for details).     Patient presents with headache.  Reports recurrent since Friday.  He is overall nontoxic and vital signs are reassuring.  Neurologic exam is normal.  No red flags.  Denies worst headache of his life.  Doubt subarachnoid hemorrhage.  No infectious symptoms.  He was not orthostatic.  Furthermore, visual acuity 20/25 in the right eye, 20/30 in the left eye.  He is fairly comfortable appearing.  Suspect his headaches may be related to working at a computer daily.  He will need to have his eyes checked.  Patient was given return precautions.  After history, exam, and medical workup I feel the patient has been appropriately medically screened and is safe for discharge home. Pertinent diagnoses were discussed with the patient. Patient was given return precautions.   Final Clinical Impressions(s) / ED Diagnoses   Final diagnoses:  Acute nonintractable headache, unspecified headache type    ED Discharge Orders    None       Wilkie Aye, Mayer Masker, MD 02/12/18 [phone]

## 2018-02-14 ENCOUNTER — Ambulatory Visit: Payer: BLUE CROSS/BLUE SHIELD | Admitting: Medical

## 2018-02-14 ENCOUNTER — Ambulatory Visit: Payer: BLUE CROSS/BLUE SHIELD | Admitting: Emergency Medicine

## 2018-02-14 ENCOUNTER — Encounter: Payer: Self-pay | Admitting: Medical

## 2018-02-14 ENCOUNTER — Ambulatory Visit (HOSPITAL_BASED_OUTPATIENT_CLINIC_OR_DEPARTMENT_OTHER)
Admission: RE | Admit: 2018-02-14 | Discharge: 2018-02-14 | Disposition: A | Discharge status: Home / Self Care | Payer: BLUE CROSS/BLUE SHIELD | Source: Ambulatory Visit | Attending: Medical | Admitting: Medical

## 2018-02-14 VITALS — BP 128/90 | HR 48 | Temp 97.6°F | Resp 16 | Ht 70.0 in | Wt 174.8 lb

## 2018-02-14 DIAGNOSIS — M47812 Spondylosis without myelopathy or radiculopathy, cervical region: Secondary | ICD-10-CM | POA: Diagnosis not present

## 2018-02-14 DIAGNOSIS — F419 Anxiety disorder, unspecified: Secondary | ICD-10-CM | POA: Diagnosis not present

## 2018-02-14 DIAGNOSIS — F439 Reaction to severe stress, unspecified: Secondary | ICD-10-CM | POA: Diagnosis not present

## 2018-02-14 DIAGNOSIS — M9902 Segmental and somatic dysfunction of thoracic region: Secondary | ICD-10-CM | POA: Diagnosis not present

## 2018-02-14 DIAGNOSIS — M50322 Other cervical disc degeneration at C5-C6 level: Secondary | ICD-10-CM | POA: Diagnosis not present

## 2018-02-14 DIAGNOSIS — R519 Headache, unspecified: Secondary | ICD-10-CM

## 2018-02-14 DIAGNOSIS — M4802 Spinal stenosis, cervical region: Secondary | ICD-10-CM | POA: Diagnosis not present

## 2018-02-14 DIAGNOSIS — M9901 Segmental and somatic dysfunction of cervical region: Secondary | ICD-10-CM | POA: Diagnosis not present

## 2018-02-14 DIAGNOSIS — R51 Headache: Secondary | ICD-10-CM | POA: Diagnosis not present

## 2018-02-14 DIAGNOSIS — M542 Cervicalgia: Secondary | ICD-10-CM

## 2018-02-14 DIAGNOSIS — J309 Allergic rhinitis, unspecified: Secondary | ICD-10-CM

## 2018-02-14 LAB — SEDIMENTATION RATE: Sed Rate: 1 mm/hr (ref 0–15)

## 2018-02-14 MED ORDER — SUMATRIPTAN SUCCINATE 50 MG PO TABS: 50.0000 mg | ORAL_TABLET | ORAL | 0 refills | Status: AC | PRN

## 2018-02-14 NOTE — Progress Notes (Signed)
Subjective:    Patient ID: Patrick Ellis, male    DOB: 07/05/1975, 42 y.o.   MRN: [id number]  HPI   Pt in for first time.  He works for [employer]. Pt works out 10-15 minutes a day. 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 Pt state formerly he could jog 11 miles. .  Pt states he eats healthy. Eats a lot of fish and vegetables. No alcohol and no smoker. Married- 3 children 42 yo, 42 yo and 42 yo.  Allergic rhinitis in fall- no flare this year.   He thinks feels stressed and anxious about work and main provider. Some insomnia related to this. 4 hours a sleep at night. 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  He states he 12 hour-15 hour days 3 days a week. Side business as Radio producercomputer consultant.   Pt states his headache that he had the other day decreased a lot. States almost gone completely. Low level intermittent ha since 2016. But no reported gross motor or sensory function deficits. He states occaisional intermittent pulsating sensation to both temporal areas.  Pt tries nyquil, vinegar or ibuprofen for his ha. Sometimes ha will be associated with light and sound sensitivity. Pt states he had got knocked out one time boxing. 3-4 other times over years that got knocked out and beat count. When has ha level 7-9/10 most of time when level flares.  ED A and P reads Patient presents with headache.  Reports recurrent since Friday.  He is overall nontoxic and vital signs are reassuring.  Neurologic exam is normal.  No red flags.  Denies worst headache of his life.  Doubt subarachnoid hemorrhage.  No infectious symptoms.  He was not orthostatic.  Furthermore, visual acuity 20/25 in the right eye, 20/30 in the left eye.  He is fairly comfortable appearing.  Suspect his headaches may be related to working at a computer daily.  He will need to have his eyes checked.  Patient was given return precautions.  After history, exam, and medical workup I feel the patient has been appropriately  medically screened and is safe for discharge home. Pertinent diagnoses were discussed with the patient. Patient was given return precautions.      Review of Systems  Constitutional: Negative for activity change, chills and fever.  HENT: Negative for congestion, drooling, ear discharge, mouth sores, nosebleeds and postnasal drip.   Respiratory: Negative for cough, chest tightness and shortness of breath.   Cardiovascular: Negative for chest pain, palpitations and leg swelling.  Gastrointestinal: Negative for abdominal pain, nausea and vomiting.  Musculoskeletal: Positive for neck pain. Negative for neck stiffness.  Neurological: Negative for dizziness, tremors, seizures, syncope, facial asymmetry, speech difficulty, weakness, light-headedness, numbness and headaches.  Psychiatric/Behavioral: Negative for agitation, behavioral problems and confusion. The patient is nervous/anxious.     Past Medical History:  Diagnosis Date  . Allergy    hay fever and fall allergies.  . Anxiety   . Chicken pox      Social History   Socioeconomic History  . Marital status: Married    Spouse name: Not on file  . Number of children: Not on file  . Years of education: Not on file  . Highest education level: Not on file  Occupational History  . Not on file  Social Needs  . Financial resource strain: Not on file  . Food insecurity:    Worry: Not on file    Inability: Not on file  . Transportation needs:  Medical: Not on file    Non-medical: Not on file  Tobacco Use  . Smoking status: Never Smoker  . Smokeless tobacco: Never Used  Substance and Sexual Activity  . Alcohol use: No    Alcohol/week: 0.0 standard drinks  . Drug use: No  . Sexual activity: Yes    Birth control/protection: None  Lifestyle  . Physical activity:    Days per week: Not on file    Minutes per session: Not on file  . Stress: Not on file  Relationships  . Social connections:    Talks on phone: Not on file     Gets together: Not on file    Attends religious service: Not on file    Active member of club or organization: Not on file    Attends meetings of clubs or organizations: Not on file    Relationship status: Not on file  . Intimate partner violence:    Fear of current or ex partner: Not on file    Emotionally abused: Not on file    Physically abused: Not on file    Forced sexual activity: Not on file  Other Topics Concern  . Not on file  Social History Narrative  . Not on file    Past Surgical History:  Procedure Laterality Date  . BUNIONECTOMY     x2 bilateral  . WISDOM TOOTH EXTRACTION      Family History  Problem Relation Age of Onset  . HIV Mother 52       Deceased  . Diabetes Father   . Diabetes Paternal Grandfather   . Diabetes Paternal Grandmother   . Diabetes type II Paternal Aunt   . Diabetes Paternal Uncle   . Stroke Maternal Aunt   . Healthy Son        x1  . Healthy Daughter        x2    No Known Allergies  No current outpatient medications on file prior to visit.   No current facility-administered medications on file prior to visit.     BP 128/90   Pulse (!) 48   Temp 97.6 F (36.4 C) (Oral)   Resp 16   Ht 5\' 10"  (1.778 m)   Wt 174 lb 12.8 oz (79.3 kg)   SpO2 100%   BMI 25.08 kg/m       Objective:   Physical Exam  General Mental Status- Alert. General Appearance- Not in acute distress.   Skin General: Color- Normal Color. Moisture- Normal Moisture. No dilated vessels seen on temporal inspection.  Neck Carotid Arteries- Normal color. Moisture- Normal Moisture. No carotid bruits. No JVD.  Chest and Lung Exam Auscultation: Breath Sounds:-Normal.  Cardiovascular Auscultation:Rythm- Regular. Murmurs & Other Heart Sounds:Auscultation of the heart reveals- No Murmurs.  Abdomen Inspection:-Inspeection Normal. Palpation/Percussion:Note:No mass. Palpation and Percussion of the abdomen reveal- Non Tender, Non Distended + BS, no rebound  or guarding.    Neurologic Cranial Nerve exam:- CN III-XII intact(No nystagmus), symmetric smile. Drift Test:- No drift. Romberg Exam:- Negative.  Heal to Toe Gait exam:-Normal. Finger to Nose:- Normal/Intact Strength:- 5/5 equal and symmetric strength both upper and lower extremities.      Assessment & Plan:  Good to meet you today.  By review of your headache history, you do have some migraine type features but also reports some spasming in the temporal regions.  Will prescribe Imitrex tablets to take as directed.  Reminder not to take any more than 2 tablets in any 24-hour period.  We will get sed rate to evaluate if you might potentially have inflammation of temporal arteries.  This would be unlikely but will proceed with caution.  Also based on your described stress and anxiety consider you might have some tension type headache features.  We will see first how you respond to Imitrex.  For history of neck pain with some pain radiating to your shoulder, will get x-ray of the cervical spine today.  On follow-up might recommend a low-dose muscle relaxant.  You do describe abnormal sleeping pattern.  Might refer you in the future to sleep specialist to see what they might recommend to give you at least 6 hours of sleep.  For seasonal allergies, would recommend Claritin over-the-counter and Flonase over-the-counter.  Follow-up in 2 weeks or as needed.  45 minutes spent with new for ED visit. 50% of time spent counseling pt on treatment and plan going forward regarding ha, anxiety, neck pain, sleeping problems and allergies.   Esperanza Richters, PA-C

## 2018-02-14 NOTE — Patient Instructions (Addendum)
Good to meet you today.  By review of your headache history, you do have some migraine type features but also reports some spasming in the temporal regions.  Will prescribe Imitrex tablets to take as directed.  Reminder not to take any more than 2 tablets in any 24-hour period.  We will get sed rate to evaluate if you might potentially have inflammation of temporal arteries.  This would be unlikely but will proceed with caution.  Also based on your described stress and anxiety consider you might have some tension type headache features.  We will see first how you respond to Imitrex.  For history of neck pain with some pain radiating to your shoulder, will get x-ray of the cervical spine today.  On follow-up might recommend a low-dose muscle relaxant.  You do describe abnormal sleeping pattern.  Might refer you in the future to sleep specialist to see what they might recommend to give you at least 6 hours of sleep.  For seasonal allergies, would recommend Claritin over-the-counter and Flonase over-the-counter.  Follow-up in 2 weeks or as needed.

## 2018-02-19 DIAGNOSIS — M9901 Segmental and somatic dysfunction of cervical region: Secondary | ICD-10-CM | POA: Diagnosis not present

## 2018-02-19 DIAGNOSIS — M542 Cervicalgia: Secondary | ICD-10-CM | POA: Diagnosis not present

## 2018-02-19 DIAGNOSIS — M47812 Spondylosis without myelopathy or radiculopathy, cervical region: Secondary | ICD-10-CM | POA: Diagnosis not present

## 2018-02-19 DIAGNOSIS — M9902 Segmental and somatic dysfunction of thoracic region: Secondary | ICD-10-CM | POA: Diagnosis not present

## 2018-02-20 DIAGNOSIS — M9901 Segmental and somatic dysfunction of cervical region: Secondary | ICD-10-CM | POA: Diagnosis not present

## 2018-02-20 DIAGNOSIS — M542 Cervicalgia: Secondary | ICD-10-CM | POA: Diagnosis not present

## 2018-02-20 DIAGNOSIS — M47812 Spondylosis without myelopathy or radiculopathy, cervical region: Secondary | ICD-10-CM | POA: Diagnosis not present

## 2018-02-20 DIAGNOSIS — M9902 Segmental and somatic dysfunction of thoracic region: Secondary | ICD-10-CM | POA: Diagnosis not present

## 2018-02-27 DIAGNOSIS — M542 Cervicalgia: Secondary | ICD-10-CM | POA: Diagnosis not present

## 2018-02-27 DIAGNOSIS — M9902 Segmental and somatic dysfunction of thoracic region: Secondary | ICD-10-CM | POA: Diagnosis not present

## 2018-02-27 DIAGNOSIS — M9901 Segmental and somatic dysfunction of cervical region: Secondary | ICD-10-CM | POA: Diagnosis not present

## 2018-02-27 DIAGNOSIS — M47812 Spondylosis without myelopathy or radiculopathy, cervical region: Secondary | ICD-10-CM | POA: Diagnosis not present

## 2018-03-14 DIAGNOSIS — M47812 Spondylosis without myelopathy or radiculopathy, cervical region: Secondary | ICD-10-CM | POA: Diagnosis not present

## 2018-03-14 DIAGNOSIS — M542 Cervicalgia: Secondary | ICD-10-CM | POA: Diagnosis not present

## 2018-03-14 DIAGNOSIS — M9901 Segmental and somatic dysfunction of cervical region: Secondary | ICD-10-CM | POA: Diagnosis not present

## 2018-03-14 DIAGNOSIS — M9902 Segmental and somatic dysfunction of thoracic region: Secondary | ICD-10-CM | POA: Diagnosis not present

## 2018-03-27 DIAGNOSIS — M9901 Segmental and somatic dysfunction of cervical region: Secondary | ICD-10-CM | POA: Diagnosis not present

## 2018-03-27 DIAGNOSIS — M542 Cervicalgia: Secondary | ICD-10-CM | POA: Diagnosis not present

## 2018-03-27 DIAGNOSIS — M47812 Spondylosis without myelopathy or radiculopathy, cervical region: Secondary | ICD-10-CM | POA: Diagnosis not present

## 2018-03-27 DIAGNOSIS — M9902 Segmental and somatic dysfunction of thoracic region: Secondary | ICD-10-CM | POA: Diagnosis not present

## 2019-08-28 DIAGNOSIS — M47812 Spondylosis without myelopathy or radiculopathy, cervical region: Secondary | ICD-10-CM | POA: Diagnosis not present

## 2019-08-28 DIAGNOSIS — M5412 Radiculopathy, cervical region: Secondary | ICD-10-CM | POA: Diagnosis not present

## 2019-08-28 DIAGNOSIS — M47816 Spondylosis without myelopathy or radiculopathy, lumbar region: Secondary | ICD-10-CM | POA: Diagnosis not present

## 2019-08-28 DIAGNOSIS — M5030 Other cervical disc degeneration, unspecified cervical region: Secondary | ICD-10-CM | POA: Diagnosis not present

## 2019-11-21 DIAGNOSIS — M5184 Other intervertebral disc disorders, thoracic region: Secondary | ICD-10-CM | POA: Diagnosis not present

## 2019-11-21 DIAGNOSIS — M898X8 Other specified disorders of bone, other site: Secondary | ICD-10-CM | POA: Diagnosis not present

## 2019-12-17 ENCOUNTER — Encounter: Payer: Self-pay | Admitting: Podiatry

## 2019-12-17 ENCOUNTER — Encounter: Payer: Self-pay | Admitting: Medical

## 2019-12-24 ENCOUNTER — Ambulatory Visit (INDEPENDENT_AMBULATORY_CARE_PROVIDER_SITE_OTHER): Payer: BC Managed Care – PPO | Admitting: Podiatry

## 2019-12-24 ENCOUNTER — Encounter: Payer: Self-pay | Admitting: Podiatry

## 2019-12-24 ENCOUNTER — Ambulatory Visit (INDEPENDENT_AMBULATORY_CARE_PROVIDER_SITE_OTHER): Payer: BC Managed Care – PPO

## 2019-12-24 ENCOUNTER — Encounter: Payer: Self-pay | Admitting: Medical

## 2019-12-24 ENCOUNTER — Encounter: Payer: Self-pay | Admitting: Orthopaedic Surgery

## 2019-12-24 ENCOUNTER — Other Ambulatory Visit: Payer: Self-pay

## 2019-12-24 DIAGNOSIS — M79673 Pain in unspecified foot: Secondary | ICD-10-CM

## 2019-12-24 DIAGNOSIS — M2550 Pain in unspecified joint: Secondary | ICD-10-CM

## 2019-12-24 DIAGNOSIS — M7750 Other enthesopathy of unspecified foot: Secondary | ICD-10-CM | POA: Diagnosis not present

## 2019-12-24 DIAGNOSIS — Z9889 Other specified postprocedural states: Secondary | ICD-10-CM

## 2019-12-24 DIAGNOSIS — M792 Neuralgia and neuritis, unspecified: Secondary | ICD-10-CM

## 2019-12-24 DIAGNOSIS — G8929 Other chronic pain: Secondary | ICD-10-CM

## 2019-12-24 DIAGNOSIS — M778 Other enthesopathies, not elsewhere classified: Secondary | ICD-10-CM

## 2019-12-24 DIAGNOSIS — M25559 Pain in unspecified hip: Secondary | ICD-10-CM

## 2019-12-25 NOTE — Progress Notes (Addendum)
Subjective:   Patient ID: Patrick Ellis, male   DOB: 44 y.o.   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  Patient feels that while he was in the [employer] he started to develop bunions as well as flatfeet.  He went on to have surgery on both of his feet for the bunions.  He states that while in the [employer] he would carries over 70 pounds of equipment on his back for several miles which aggravated his feet.  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  He has been having back issues as well as hip issues.  He states that he will feel numbness in his feet as well as aching which started before and after surgery.  His back also gets stiff and he gets headaches when he develops radiating pain and a zapping sensation.  He feels that the symptoms in his feet is back correlate.  He states that he does not want any treatment at this point for his feet but is looking for another opinion. He is currently covered under the [place] and will likely go there for further treatment but would like another opinion at this time.  He does have orthotics he does not want any further surgery.  He wants to have his feet evaluated to see if there is any relationship.   Review of Systems  All other systems reviewed and are negative.  Past Medical History:  Diagnosis Date  . Allergy    hay fever and fall allergies.  . Anxiety   . Chicken pox     Past Surgical History:  Procedure Laterality Date  . BUNIONECTOMY     x2 bilateral  . WISDOM TOOTH EXTRACTION       Current Outpatient Medications:  .  acetaminophen (TYLENOL) 500 MG tablet, Take by mouth., Disp: , Rfl:  .  DULoxetine (CYMBALTA) 30 MG capsule, Take by mouth., Disp: , Rfl:  .  gabapentin (NEURONTIN) 100 MG capsule, Take 100 mg by mouth 3 (three) times daily., Disp: , Rfl:  .   ibuprofen (ADVIL) 200 MG tablet, Take by mouth., Disp: , Rfl:  .  Multiple Vitamin (MULTIVITAMIN) capsule, Take 1 capsule by mouth daily., Disp: , Rfl:  .  SUMAtriptan (IMITREX) 50 MG tablet, Take 1 tablet (50 mg total) by mouth every 2 (two) hours as needed for migraine. May repeat in 2 hours if headache persists or recurs., Disp: 10 tablet, Rfl: 0  No Known Allergies        Objective:  Physical Exam  General: AAO x3, NAD  Dermatological: Skin is warm, dry and supple bilateral. There are no open sores, no preulcerative lesions, no rash or signs of infection present.  Vascular: Dorsalis Pedis artery and Posterior Tibial artery pedal pulses are 2/4 bilateral with immedate capillary fill time. There is no pain with calf compression, swelling, warmth, erythema.   Neruologic: Grossly intact via light touch bilateral.  Sensation intact with Semmes Weinstein monofilament.  Musculoskeletal: There is global diffuse tenderness to bilateral feet.  Majority tenderness is appearing on the first MPJ there is a decrease in the motion of the first MPJ.  Flexor, extensor tendons appear to be intact.  Muscular strength 5/5 in all groups tested bilateral.  Gait: Unassisted, Nonantalgic.       Assessment:   Bilateral  chronic foot pain, neuritis  Plan:  -Treatment options discussed including all alternatives, risks, and complications -Etiology of symptoms were discussed -X-rays were obtained and reviewed with the patient.  Previous bunion surgery is evident.  No evidence of acute fracture. -Today we had a discussion regards to his feet as well as other issues.  There could be a correlation from his feet causing other joint pains because he feels that he is walking differently altering his gait which can cause hip and knee pain.  I would like him to see orthopedics for his hip pain and a referral was placed.  Also applied some of the back pain could be causing symptoms to his feet. -He would like for  me to write a letter to this regard.  I will do so for him. -For now continue orthotics, supportive shoes.  Vivi Barrack DPM

## 2019-12-27 NOTE — Telephone Encounter (Signed)
I do not do this type of evaluation.

## 2019-12-29 ENCOUNTER — Encounter: Payer: Self-pay | Admitting: Podiatry

## 2019-12-30 ENCOUNTER — Telehealth: Payer: Self-pay | Admitting: Medical

## 2019-12-30 ENCOUNTER — Ambulatory Visit (INDEPENDENT_AMBULATORY_CARE_PROVIDER_SITE_OTHER): Payer: BLUE CROSS/BLUE SHIELD | Admitting: Medical

## 2019-12-30 ENCOUNTER — Other Ambulatory Visit: Payer: Self-pay

## 2019-12-30 ENCOUNTER — Encounter: Payer: Self-pay | Admitting: Medical

## 2019-12-30 VITALS — BP 138/95 | HR 59 | Resp 16 | Ht 69.0 in | Wt 176.8 lb

## 2019-12-30 DIAGNOSIS — M545 Low back pain, unspecified: Secondary | ICD-10-CM | POA: Diagnosis not present

## 2019-12-30 DIAGNOSIS — R030 Elevated blood-pressure reading, without diagnosis of hypertension: Secondary | ICD-10-CM

## 2019-12-30 DIAGNOSIS — M542 Cervicalgia: Secondary | ICD-10-CM

## 2019-12-30 DIAGNOSIS — M546 Pain in thoracic spine: Secondary | ICD-10-CM | POA: Diagnosis not present

## 2019-12-30 DIAGNOSIS — M25511 Pain in right shoulder: Secondary | ICD-10-CM

## 2019-12-30 DIAGNOSIS — G8929 Other chronic pain: Secondary | ICD-10-CM

## 2019-12-30 DIAGNOSIS — M25559 Pain in unspecified hip: Secondary | ICD-10-CM

## 2019-12-30 DIAGNOSIS — R040 Epistaxis: Secondary | ICD-10-CM

## 2019-12-30 NOTE — Addendum Note (Signed)
Addended by: Gwenevere Abbot on: 12/30/2019 05:34 PM   Modules accepted: Level of Service

## 2019-12-30 NOTE — Progress Notes (Addendum)
Subjective:    Patient ID: Patrick Ellis, male    DOB: 01/04/1976, 44 y.o.   MRN: [id number]  HPI   Pt in today. I have not seen him for 2 years. On last visit note wanted him to follow up in 2 weeks. He has multiple problems see the long letter he sent(I did not see until day of service as had some time off)  . 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 . Doe not drink, does not smoke. Married- has children.  He is 70% rated disabillity  forfeet injuries/surgery. He [place] in [place]. He finished active duty in 1999.  Pt has history of feet pain and back pain as his major problems. The [facility] has told him his back pain is not associated with feet pain. Pt has seen Dr. Loreta AveWagner.   Pt has neck pain, t spine and lumbar back pain for more than a year per pt. Pt is seeing rheumatologist and will be seeing neurologist in near future.  Pt is also going to see Dr. Roda ShuttersXu for hip pain   Pt sent me long note recently.  Pt also seeing pain specialist as well but not with [facility].  He will be seeing [facility] hematologist for potential bone marrow issues.  Pt states all areas of pain are equal. Pt average pain level 4-5/10. But he expresses want to eventually file a claim.  Pt is going to see Dr. Venetia MaxonStern neurosurgeon.      "My Name is Patrick Ellis; [employer]. I really need your assistance. Early in my [employer] service, bunions began to form on my feet, due to the intense training and duties. I endured the foot pain, while continuing to do my job. After a couple of years, the pain in my feet had become a real hinderance, so finally I allowed millitary surgeons to remove the bunions, they were able to convince me that the surgeries would rid me of the pain, but my feet were never the same afterwards.  Following the surgeries, I completed my service performing full duties. 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 Incedently, early in my [employer]ruit Training [phone]) I was forced to wear shoes that were a size too small. I complained unsuccessfully for 13 weeks, continued to hike, march while my  feet blistered  and would go numb. Beyond having the surgical treatment in the [employer] I never pursued any benefits or treatment from the [place] until 2017.   While serving and years after I was discharge,  I had grown used  to managing my foot pain along with the occassional joint aches, back pains along with sleep issues and anxienty.  To date I am experiencing back issues, which I understand may be the cause of my  headaches, neck pain, shoulder pain, numbness in elbow and fingers, mid-back, low- back, hip/butt, foot, and nerve conductive pain. Over the last 4-5 years began experiencing things like nose bleeds and breathing issues or high blood pressure, and the zapping. No one knows what that is about.  I have been on medication, undergone chriropratic therapy, physical therapy, psyco therapy. I am getting so tired and starting to feel like I am crazy.  I am scheduled to be examined  by a rheumatologist and hemotologist concerning other possible question of concerns being investigated by my [facility] provider. Outside of that I am seeking the opinions of specialists outside of the [place].  I've had only one sex partner in my life, never once  taken a drink of alcohol, never done any illegal drugs, never smoked tobacco or marijuana. I used to enjoy weight lifting,  long distance running, and other outdoor activities. I think that I have done everything right, to maintained a very disciplined lifestyle.  I am still rated at 70 percent by the [place], due to foot issues associated with Bilateral Flat feet and bunionectomies. It is still my belief that foot issues accounts  for 100% of my back issues along what I am experiencing today. In addition to getting getting an understanding health of my body as a whole, along with being educated concerning actions that needs to be taken so that I can make some decisions."  Spent a lot of time listening in order to get understanding on pt various complaints. Essentially going over the above as he recounts the above.     Review of Systems  Constitutional: Negative for chills, fatigue and fever.  Respiratory: Negative for cough, chest tightness, shortness of breath and wheezing.   Cardiovascular: Negative for chest pain and palpitations.  Gastrointestinal: Negative for abdominal pain.  Musculoskeletal: Positive for back pain.       Shoulder pain and hip pain. Both rt side.  Skin: Negative for rash.  Neurological: Negative for dizziness, seizures, speech difficulty, weakness and light-headedness.       Some upper extremity rt shoulder/arm pain associated with neck pain.  Hematological: Negative for adenopathy.  Psychiatric/Behavioral: Negative for behavioral problems, dysphoric mood and suicidal ideas. The patient is not nervous/anxious.     Past Medical History:  Diagnosis Date  . Allergy    hay fever and fall allergies.  . Anxiety   . Chicken pox      Social History   Socioeconomic History  . Marital status: Married    Spouse name: Not on file  . Number of children: Not on file  . Years of education: Not on file  . Highest education level: Not on file  Occupational History  . Not on file  Tobacco Use  . Smoking status: Never Smoker  . Smokeless tobacco: Never Used  Substance and Sexual Activity  . Alcohol use: No    Alcohol/week: 0.0 standard drinks  . Drug use: No  . Sexual activity: Yes    Birth control/protection: None  Other Topics Concern  . Not on file  Social History Narrative  . Not on file   Social Determinants of Health   Financial Resource Strain:   . Difficulty of Paying Living  Expenses: Not on file  Food Insecurity:   . Worried About Programme researcher, broadcasting/film/video in the Last Year: Not on file  . Ran Out of Food in the Last Year: Not on file  Transportation Needs:   . Lack of Transportation (Medical): Not on file  . Lack of Transportation (Non-Medical): Not on file  Physical Activity:   . Days of Exercise per Week: Not on file  . Minutes of Exercise per Session: Not on file  Stress:   . Feeling of Stress : Not on file  Social Connections:   . Frequency of Communication with Friends and Family: Not on file  . Frequency of Social Gatherings with Friends and Family: Not on file  . Attends  Religious Services: Not on file  . Active Member of Clubs or Organizations: Not on file  . Attends Banker Meetings: Not on file  . Marital Status: Not on file  Intimate Partner Violence:   . Fear of Current or Ex-Partner: Not on file  . Emotionally Abused: Not on file  . Physically Abused: Not on file  . Sexually Abused: Not on file    Past Surgical History:  Procedure Laterality Date  . BUNIONECTOMY     x2 bilateral  . WISDOM TOOTH EXTRACTION      Family History  Problem Relation Age of Onset  . HIV Mother 79       Deceased  . Diabetes Father   . Diabetes Paternal Grandfather   . Diabetes Paternal Grandmother   . Diabetes type II Paternal Aunt   . Diabetes Paternal Uncle   . Stroke Maternal Aunt   . Healthy Son        x1  . Healthy Daughter        x2    No Known Allergies  Current Outpatient Medications on File Prior to Visit  Medication Sig Dispense Refill  . DULoxetine (CYMBALTA) 30 MG capsule Take by mouth.    Marland Kitchen acetaminophen (TYLENOL) 500 MG tablet Take by mouth. (Patient not taking: Reported on 12/30/2019)    . gabapentin (NEURONTIN) 100 MG capsule Take 100 mg by mouth 3 (three) times daily. (Patient not taking: Reported on 12/30/2019)    . ibuprofen (ADVIL) 200 MG tablet Take by mouth. (Patient not taking: Reported on 12/30/2019)    . Multiple  Vitamin (MULTIVITAMIN) capsule Take 1 capsule by mouth daily. (Patient not taking: Reported on 12/30/2019)    . SUMAtriptan (IMITREX) 50 MG tablet Take 1 tablet (50 mg total) by mouth every 2 (two) hours as needed for migraine. May repeat in 2 hours if headache persists or recurs. (Patient not taking: Reported on 12/30/2019) 10 tablet 0   No current facility-administered medications on file prior to visit.    BP (!) 138/95   Pulse (!) 59   Resp 16   Ht 5\' 9"  (1.753 m)   Wt 176 lb 12.8 oz (80.2 kg)   SpO2 100%   BMI 26.11 kg/m      Objective:   Physical Exam  General Mental Status- Alert. General Appearance- Not in acute distress.   Skin General: Color- Normal Color. Moisture- Normal Moisture.  Neck Carotid Arteries- Normal color. Moisture- Normal Moisture. No carotid bruits. No JVD.  Chest and Lung Exam Auscultation: Breath Sounds:-Normal.  Cardiovascular Auscultation:Rythm- Regular. Murmurs & Other Heart Sounds:Auscultation of the heart reveals- No Murmurs.  Abdomen Inspection:-Inspeection Normal. Palpation/Percussion:Note:No mass. Palpation and Percussion of the abdomen reveal- Non Tender, Non Distended + BS, no rebound or guarding.    Neurologic Cranial Nerve exam:- CN III-XII intact(No nystagmus), symmetric smile. Strength:- 5/5 equal and symmetric strength both upper and lower extremities.  Feet- flat feet with old surgery scars.    Assessment & Plan:  You have various pain in your body in addition to history of severe chronic feet pain. Some of meds you are on are often prescribed for fibromyalgia.  Will need to follow your various specialist referrals.  If normal at [facility] please have fax over the results.   Continue current medications.  Your blood pressure is elevated today.  But it could be a combination of being in our office and the fact that your in chronic pain.  Check blood pressure at  home and if your blood pressure is close to or above  140/90 then would recommend low-dose blood pressure medication.  Would asked that you look for recent metabolic panel, CBC and lipid panel done through the [place].  If you can find that and you could send me a copy through my chart.  That way I can scan into our records.  Follow-up in 1 month or as needed.  Time spent with new patient today was 44  minutes which consisted of chart review, discussing diagnoses, work up, referral that he has in progress, current  treatment and documentation.  Pt expressed to me "so what did we get accomplished today?" as he was wanted some to agree/ "sign off" that his body pains/particularly back pain is associated with his feet issues. I expressed he need to follow thru with all of his specialist and then we can follow up and see what they say. Pt felt like he waisted his time and would not have scheduled as he felt got nowhere. He expressed in his mind I spent 30 minutes with him.

## 2019-12-30 NOTE — Telephone Encounter (Signed)
Pt needs to be dismissed from practice. Reviewed with Dr. Laury Axon. Non therapeutic relationship.  I can't find letter template in [employer].

## 2019-12-30 NOTE — Patient Instructions (Addendum)
You have various pain in your body in addition to history of severe chronic feet pain. Some of meds you are on are often prescribed for fibromyalgia.  Will need to follow your various specialist referrals.  If normal at [facility] affiliated please have fax over the results.   Continue current medications.  Your blood pressure is elevated today.  But it could be a combination of being in our office and the fact that your in chronic pain.  Check blood pressure at home and if your blood pressure is close to or above 140/90 then would recommend low-dose blood pressure medication.  Would asked that you look for recent metabolic panel, CBC and lipid panel done through the [place].  If you can find that and you could send me a copy through my chart.  That way I can scan into our records.  Follow-up in 1 month or as needed.

## 2019-12-31 ENCOUNTER — Encounter: Payer: Self-pay | Admitting: Medical

## 2019-12-31 NOTE — Telephone Encounter (Signed)
yes

## 2019-12-31 NOTE — Addendum Note (Signed)
Addended by: Gwenevere Abbot on: 12/31/2019 02:15 PM   Modules accepted: Level of Service

## 2020-01-01 ENCOUNTER — Ambulatory Visit: Payer: Medicaid Other | Admitting: Orthopaedic Surgery

## 2020-01-01 ENCOUNTER — Encounter: Payer: Self-pay | Admitting: Podiatry

## 2020-01-01 ENCOUNTER — Telehealth: Payer: Self-pay | Admitting: Medical

## 2020-01-01 NOTE — Telephone Encounter (Signed)
Caller : Patrick Ellis  Call Back # [phone]   Patient called asking for a return call from office mgt. In reference to dismal.

## 2020-01-01 NOTE — Telephone Encounter (Signed)
Spoke with patient at length regarding his dismissal.  After hearing his concerns I think the underlying issue is that he he felt he had over communicated and Ramon Dredge was still unsure of what exactly he needed to do for the patient since he already had specialists and coordinated care. He receives primary services through the [place] which also limits what we can do as far as a primary care. Patient was under the impression this was more of a second opinion and I explained to him that we are unable to offer a second opinion. I also explained that at times [facility] is hard [facility] because [facility] not [facility]. They depend on the patient to bring their own records to their primary care  He was extremely apologetic and wanted me to share with Ramon Dredge that he was very sorry about the miscommunication.

## 2020-01-03 ENCOUNTER — Encounter: Payer: Self-pay | Admitting: Medical

## 2020-01-07 ENCOUNTER — Ambulatory Visit (INDEPENDENT_AMBULATORY_CARE_PROVIDER_SITE_OTHER): Payer: BLUE CROSS/BLUE SHIELD | Admitting: Orthopaedic Surgery

## 2020-01-07 ENCOUNTER — Encounter: Payer: Self-pay | Admitting: Orthopaedic Surgery

## 2020-01-07 ENCOUNTER — Ambulatory Visit: Payer: Self-pay

## 2020-01-07 ENCOUNTER — Encounter: Payer: Self-pay | Admitting: Medical

## 2020-01-07 VITALS — Ht 70.5 in | Wt 170.8 lb

## 2020-01-07 DIAGNOSIS — M545 Low back pain, unspecified: Secondary | ICD-10-CM | POA: Diagnosis not present

## 2020-01-07 DIAGNOSIS — M25551 Pain in right hip: Secondary | ICD-10-CM | POA: Diagnosis not present

## 2020-01-07 NOTE — Progress Notes (Signed)
Office Visit Note   Patient: Patrick Ellis           Date of Birth: 15-Sep-1975           MRN: [id number] Visit Date: 01/07/2020              Requested by: Vivi Barrack, DPM [address] [place],  [place] [address] PCP: Esperanza Richters, PA-C   Assessment & Plan: Visit Diagnoses:  1. Pain in right hip   2. Low back pain, unspecified back pain laterality, unspecified chronicity, unspecified whether sciatica present     Plan: Impression is right hip pain concerning for labral tear.  He has a cam lesion found on x-ray.  Based on discussion he would like to try a course of outpatient physical therapy and strengthening.  He declined cortisone injection today.  We will see him back as needed.  Follow-Up Instructions: Return if symptoms worsen or fail to improve.   Orders:  Orders Placed This Encounter  Procedures  . XR HIP UNILAT W OR W/O PELVIS 2-3 VIEWS RIGHT  . XR Lumbar Spine 2-3 Views  . Ambulatory referral to Physical Therapy   No orders of the defined types were placed in this encounter.     Procedures: No procedures performed   Clinical Data: No additional findings.   Subjective: Chief Complaint  Patient presents with  . Right Hip - Pain    Mr. Lazarz is a 44 year old gentleman who comes in for evaluation of chronic right hip pain.  He has established care at the [facility] for his chronic back pain.  He states that he has had groin and hip pain since 1999 when he got out of the [employer]	[place].  He has not had any injections.  He feels like he has a little limp and the catching sensation of the right hip region.  He has tried using aspirin ibuprofen as needed.  Denies any numbness and tingling   Review of Systems  Constitutional: Negative.   All other systems reviewed and are negative.    Objective: Vital Signs: Ht 5' 10.5" (1.791 m)   Wt 170 lb 12.8 oz (77.5 kg)   BMI 24.16 kg/m   Physical Exam Vitals and nursing note reviewed.   Constitutional:      Appearance: He is well-developed.  HENT:     Head: Normocephalic and atraumatic.  Eyes:     Pupils: Pupils are equal, round, and reactive to light.  Pulmonary:     Effort: Pulmonary effort is normal.  Abdominal:     Palpations: Abdomen is soft.  Musculoskeletal:        General: Normal range of motion.     Cervical back: Neck supple.  Skin:    General: Skin is warm.  Neurological:     Mental Status: He is alert and oriented to person, place, and time.  Psychiatric:        Behavior: Behavior normal.        Thought Content: Thought content normal.        Judgment: Judgment normal.     Ortho Exam Right hip shows negative logroll.  Mild pain with Stinchfield with good's strength.  Mildly positive FADIR test.  Negative Faber.  Hip is nontender to palpation.  Specialty Comments:  No specialty comments available.  Imaging: XR HIP UNILAT W OR W/O PELVIS 2-3 VIEWS RIGHT  Result Date: 01/07/2020 Right femoral head cam lesion.  Small os acetabuli.  No significant degenerative  changes.  XR Lumbar Spine 2-3 Views  Result Date: 01/07/2020 No acute or structural abnormalities.    PMFS History: Patient Active Problem List   Diagnosis Date Noted  . Persistent circadian rhythm sleep disorder, shift work type 08/14/2017  . Chronic insomnia 08/14/2017  . Sleep deprivation 08/14/2017  . Arthralgia of back 08/14/2017  . Elbow pain 09/24/2015  . Visit for preventive health examination 01/25/2013   Past Medical History:  Diagnosis Date  . Allergy    hay fever and fall allergies.  . Anxiety   . Back pain   . Chicken pox   . Chronic pain of both feet     Family History  Problem Relation Age of Onset  . HIV Mother 84       Deceased  . Diabetes Father   . Diabetes Paternal Grandfather   . Diabetes Paternal Grandmother   . Diabetes type II Paternal Aunt   . Diabetes Paternal Uncle   . Stroke Maternal Aunt   . Healthy Son        x1  . Healthy Daughter         x2    Past Surgical History:  Procedure Laterality Date  . BUNIONECTOMY     x2 bilateral  . WISDOM TOOTH EXTRACTION     Social History   Occupational History  . Not on file  Tobacco Use  . Smoking status: Never Smoker  . Smokeless tobacco: Never Used  Substance and Sexual Activity  . Alcohol use: No    Alcohol/week: 0.0 standard drinks  . Drug use: No  . Sexual activity: Yes    Birth control/protection: None

## 2020-01-27 ENCOUNTER — Encounter: Payer: Self-pay | Admitting: Podiatry

## 2020-01-28 ENCOUNTER — Encounter: Payer: Self-pay | Admitting: Orthopaedic Surgery

## 2020-01-29 ENCOUNTER — Encounter: Payer: Self-pay | Admitting: Podiatry

## 2020-01-29 ENCOUNTER — Encounter: Payer: Self-pay | Admitting: Orthopaedic Surgery

## 2020-01-29 DIAGNOSIS — Z6826 Body mass index (BMI) 26.0-26.9, adult: Secondary | ICD-10-CM | POA: Diagnosis not present

## 2020-01-29 DIAGNOSIS — M5412 Radiculopathy, cervical region: Secondary | ICD-10-CM | POA: Diagnosis not present

## 2020-01-29 DIAGNOSIS — R030 Elevated blood-pressure reading, without diagnosis of hypertension: Secondary | ICD-10-CM | POA: Diagnosis not present

## 2020-01-29 DIAGNOSIS — M47812 Spondylosis without myelopathy or radiculopathy, cervical region: Secondary | ICD-10-CM | POA: Diagnosis not present

## 2020-03-05 IMAGING — DX DG CERVICAL SPINE COMPLETE 4+V
5 series · 5 of 5 positions shown · non-contrast
Comparison: None.

CLINICAL DATA: Chronic right-sided neck and right arm pain.

EXAM:
CERVICAL SPINE - COMPLETE 4+ VIEW

[c-spine lat]
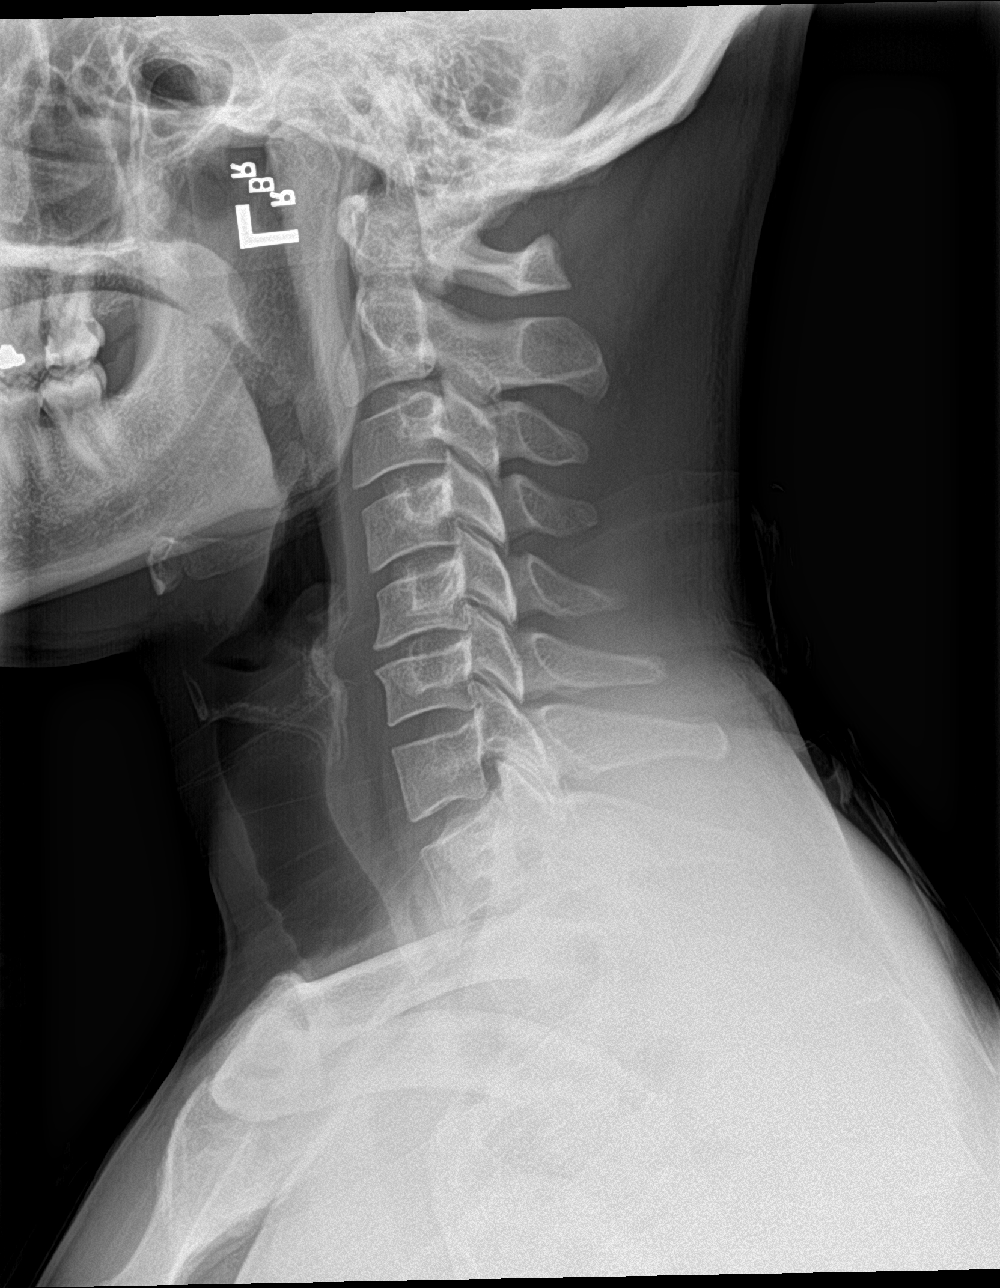

[c-spine obl (1 of 2)]
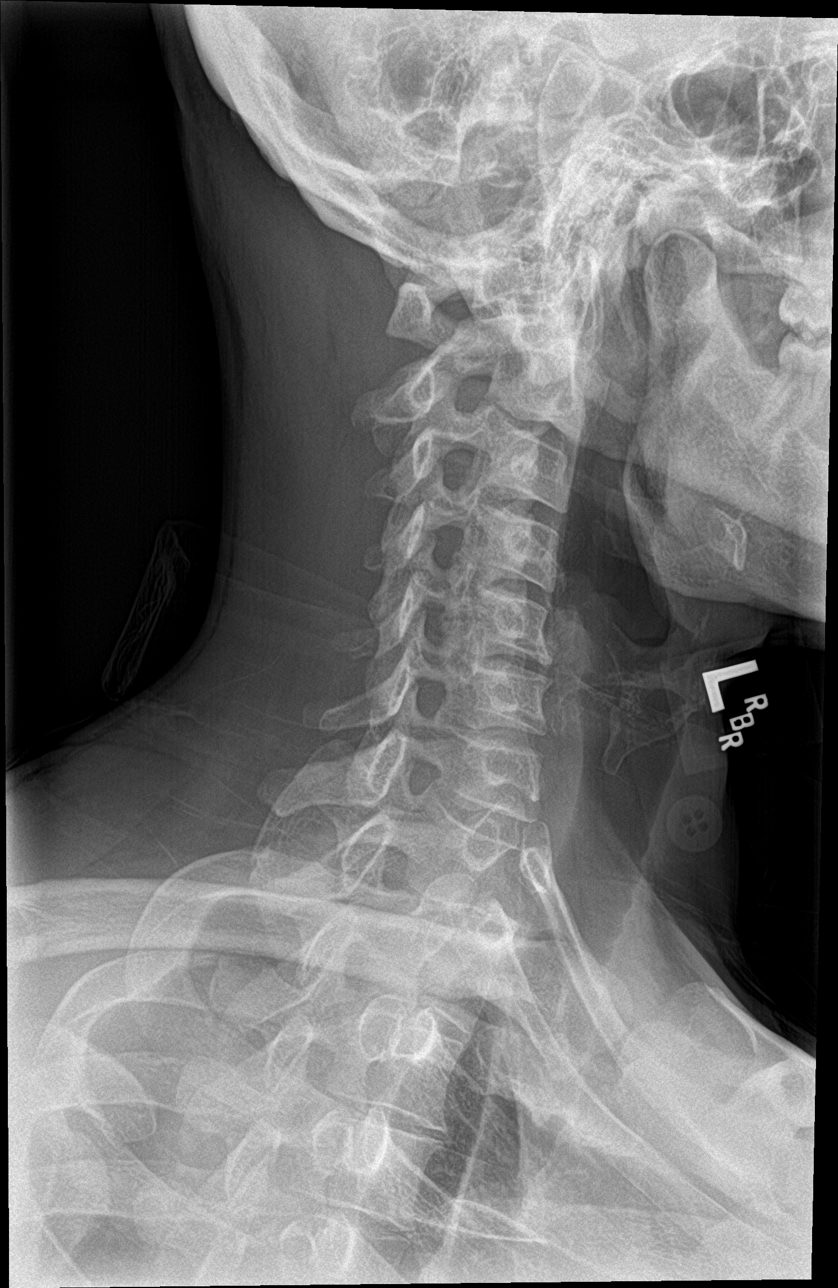

[c-spine obl (2 of 2)]
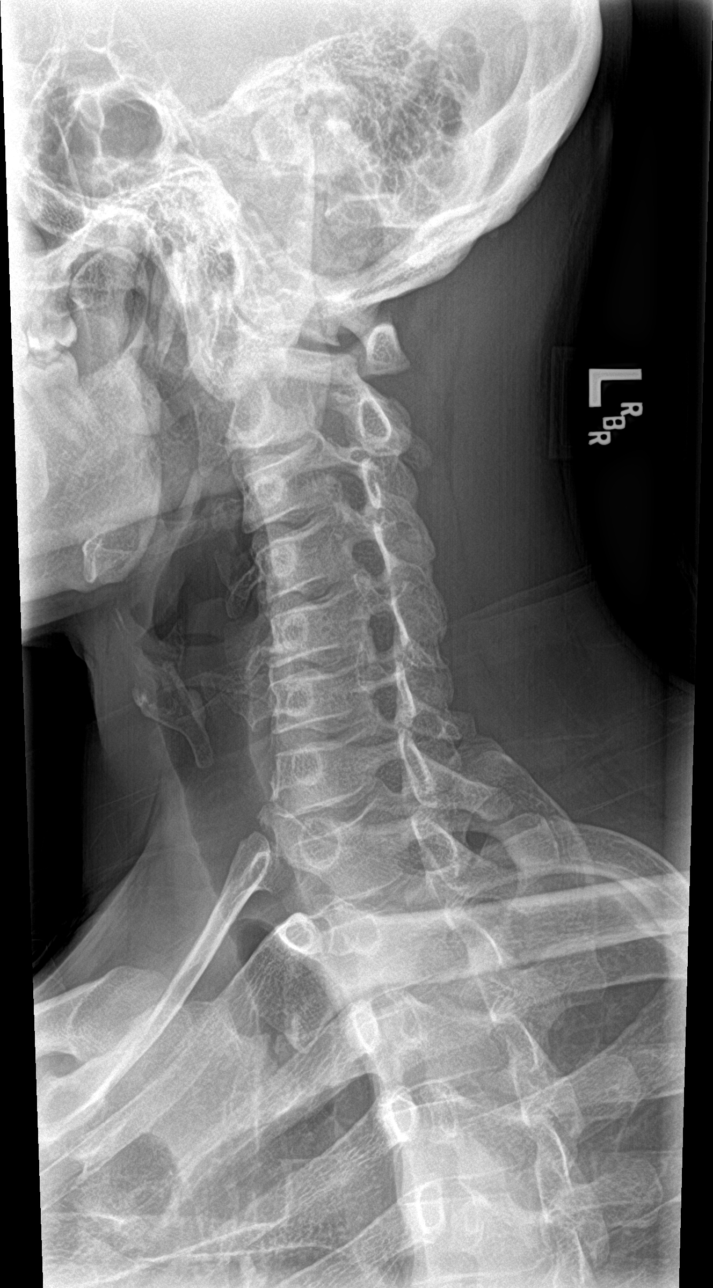

[c-spine ap]
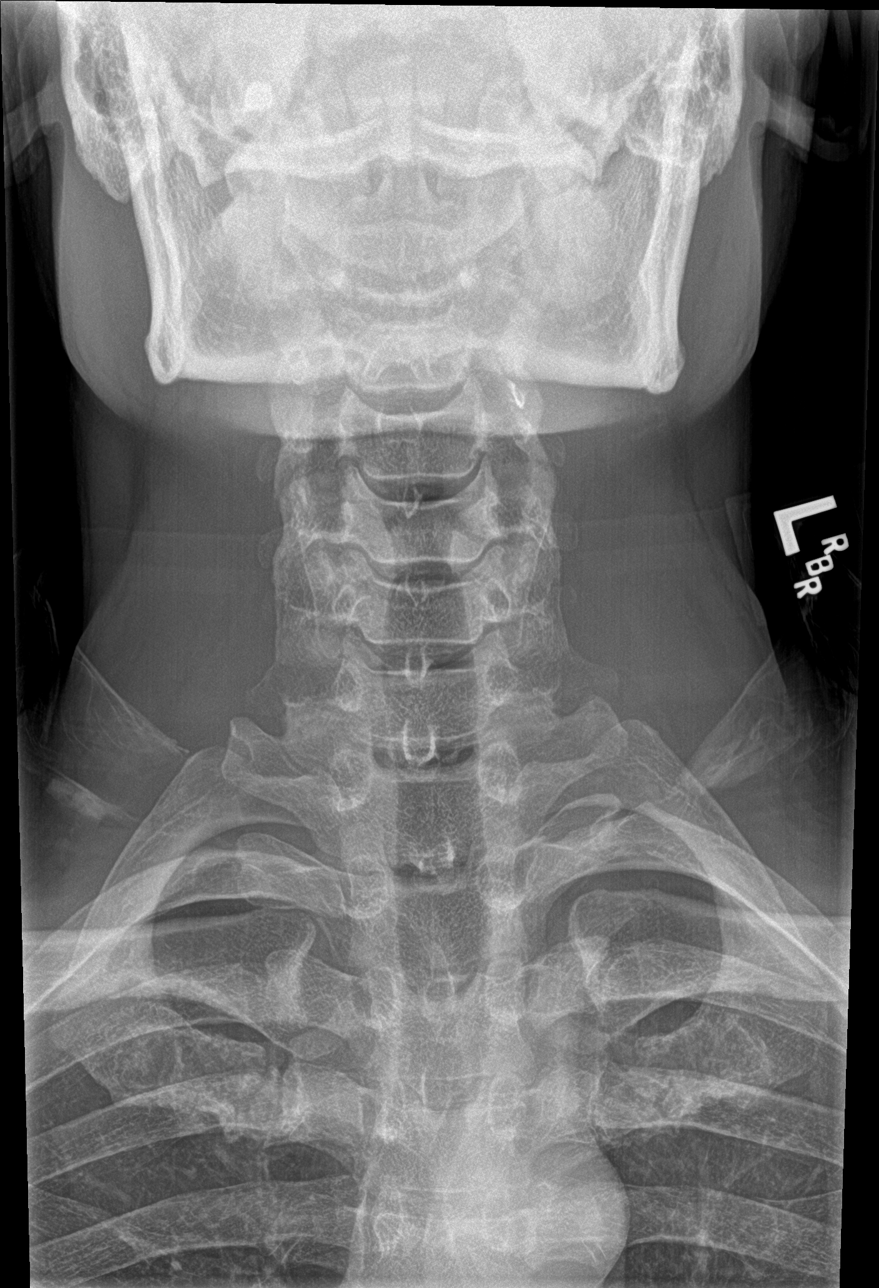

[c-spine open mouth]
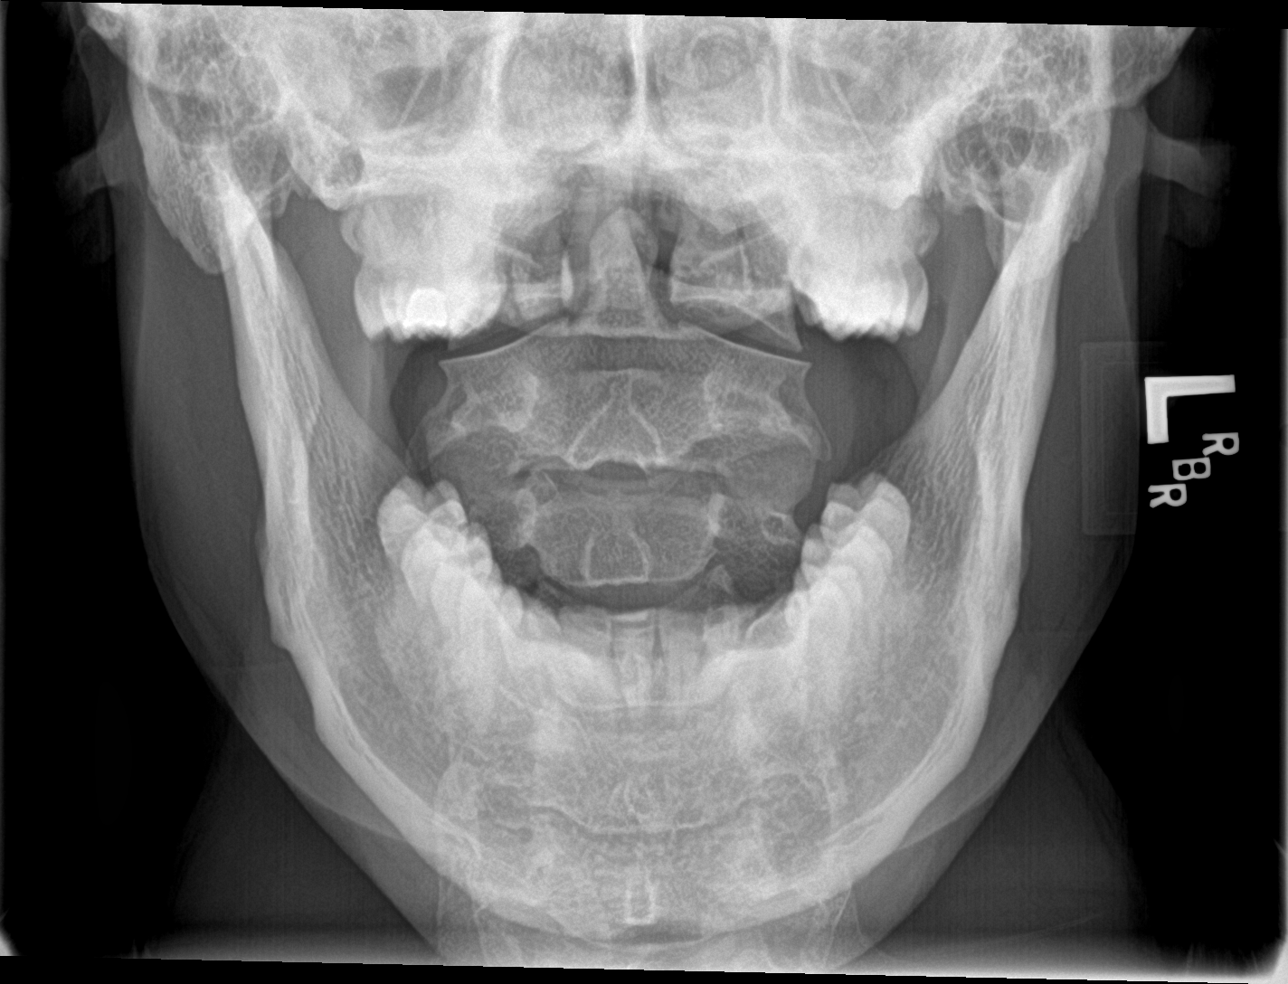

[5 of 5 positions shown; findings below may reference images not displayed]

FINDINGS: No fracture or spondylolisthesis is noted. Mild degenerative disc
disease is noted at C5-6 with mild right neural foraminal stenosis
secondary to uncovertebral spurring. Remaining disc spaces are
unremarkable. No prevertebral soft tissue swelling is noted.
IMPRESSION: Mild degenerative disc disease is noted at C5-6 with mild
right-sided neural foraminal stenosis secondary to uncovertebral
spurring. No acute abnormality seen in the cervical spine.

## 2020-03-11 ENCOUNTER — Encounter: Payer: Self-pay | Admitting: Orthopaedic Surgery

## 2020-03-11 ENCOUNTER — Encounter: Payer: Self-pay | Admitting: Podiatry

## 2020-03-19 ENCOUNTER — Encounter: Payer: Self-pay | Admitting: Podiatry

## 2020-04-16 ENCOUNTER — Encounter: Payer: Self-pay | Admitting: Neurology
